# Patient Record
Sex: Female | Born: 1935 | Marital: Married | Smoking: Never smoker
Health system: Southern US, Community
[De-identification: ages and names within clinical notes are randomized; demographics above are authoritative.]

## PROBLEM LIST (undated history)

## (undated) DIAGNOSIS — I639 Cerebral infarction, unspecified: Secondary | ICD-10-CM

## (undated) HISTORY — DX: Cerebral infarction, unspecified: I63.9

---

## 2014-02-26 ENCOUNTER — Emergency Department: Payer: Self-pay | Admitting: Emergency Medicine

## 2018-09-22 ENCOUNTER — Emergency Department: Payer: Medicaid Other

## 2018-09-22 ENCOUNTER — Emergency Department
Admission: EM | Admit: 2018-09-22 | Discharge: 2018-09-22 | Disposition: A | Discharge status: Home / Self Care | Payer: Medicaid Other | Attending: Emergency Medicine | Admitting: Emergency Medicine

## 2018-09-22 ENCOUNTER — Other Ambulatory Visit: Payer: Self-pay

## 2018-09-22 ENCOUNTER — Encounter: Payer: Self-pay | Admitting: Emergency Medicine

## 2018-09-22 DIAGNOSIS — M25512 Pain in left shoulder: Secondary | ICD-10-CM | POA: Insufficient documentation

## 2018-09-22 LAB — BASIC METABOLIC PANEL
Anion gap: 8 (ref 5–15)
BUN: 18 mg/dL (ref 8–23)
CALCIUM: 9.5 mg/dL (ref 8.9–10.3)
CHLORIDE: 104 mmol/L (ref 98–111)
CO2: 27 mmol/L (ref 22–32)
CREATININE: 0.88 mg/dL (ref 0.44–1.00)
GFR calc non Af Amer: 60 mL/min — ABNORMAL LOW (ref >60)
Glucose, Bld: 93 mg/dL (ref 70–99)
Potassium: 4.3 mmol/L (ref 3.5–5.1)
Sodium: 139 mmol/L (ref 135–145)

## 2018-09-22 LAB — FIBRIN DERIVATIVES D-DIMER (ARMC ONLY): FIBRIN DERIVATIVES D-DIMER (ARMC): 469.45 ng{FEU}/mL (ref 0.00–499.00)

## 2018-09-22 LAB — CBC
HEMATOCRIT: 36 % (ref 35.0–47.0)
HEMOGLOBIN: 11.8 g/dL — AB (ref 12.0–16.0)
MCH: 28.7 pg (ref 26.0–34.0)
MCHC: 32.7 g/dL (ref 32.0–36.0)
MCV: 87.7 fL (ref 80.0–100.0)
Platelets: 258 10*3/uL (ref 150–440)
RBC: 4.11 MIL/uL (ref 3.80–5.20)
RDW: 13.5 % (ref 11.5–14.5)
WBC: 4.7 10*3/uL (ref 3.6–11.0)

## 2018-09-22 LAB — TROPONIN I: Troponin I: <0.03 ng/mL (ref <0.03)

## 2018-09-22 MED ORDER — ACETAMINOPHEN 500 MG PO TABS
1000.0000 mg | ORAL_TABLET | Freq: Once | ORAL | Status: AC
  Administered 2018-09-22: 1000 mg via ORAL
  Filled 2018-09-22: qty 2

## 2018-09-22 MED ORDER — OXYCODONE HCL 5 MG PO TABS: 2.5000 mg | ORAL_TABLET | Freq: Four times a day (QID) | ORAL | 0 refills | Status: DC | PRN

## 2018-09-22 NOTE — ED Notes (Signed)
RN in room to administer ordered medication. Pt is currently off the floor in radiology.

## 2018-09-22 NOTE — Discharge Instructions (Addendum)
Pain control: Take tylenol 1000mg every 8 hours. Take 2.5mg of oxycodone every 6 hours for breakthrough pain. If you need the oxycodone make sure to take one senokot as well to prevent constipation. ° °Do not drink alcohol, drive or participate in any other potentially dangerous activities while taking this medication as it may make you sleepy. Do not take this medication with any other sedating medications, either prescription or over-the-counter. ° °

## 2018-09-22 NOTE — ED Triage Notes (Signed)
L shoulder pain x 1 week, usually has shoulder pain but worse. Hurts with movement. Denies fall or injury.

## 2018-09-22 NOTE — ED Provider Notes (Signed)
[facility] Emergency Department Provider Note  ____________________________________________  Time seen: Approximately 9:14 AM  I have reviewed the triage vital signs and the nursing notes.   HISTORY  Chief Complaint Shoulder Pain  Level 5 caveat:  Portions of the history and physical were unable to be obtained due to language barrier (granddaughter used as interpreter, no interpreter for patient's language available)   HPI Allison Ballard is a 84 y.o. female no significant past medical history who presents for evaluation of left shoulder pain.  Patient reports that she has had pain for 15 years in the shoulder that she developed while lifting a heavy box.  She reports that the pain is mild and usually bearable however over the last week the pain has been severe.  She has no pain at rest but has severe pain with movement of her shoulder and has had decreased range of motion due to the pain.  No fever or chills, no chest pain or shortness of breath.  The pain seems to radiate to the left scapula region.  No recent trauma.  Patient reports that she woke up with this pain a week ago.  She has tried ibuprofen at home which helps.  Patient reports no medical problems however has not seen a doctor in several years.  No history of smoking.  No personal family history of blood clots.  PMH Chronic left shoulder pain  Allergies Patient has no known allergies.  FH No heart disease, PE/DVT  Social History Smoking: never Alcohol: no Drugs: no   Review of Systems  Constitutional: Negative for fever. Eyes: Negative for visual changes. ENT: Negative for sore throat. Neck: No neck pain  Cardiovascular: Negative for chest pain. Respiratory: Negative for shortness of breath. Gastrointestinal: Negative for abdominal pain, vomiting or diarrhea. Genitourinary: Negative for dysuria. Musculoskeletal: Negative for back pain. + L shoulder pain Skin: Negative for  rash. Neurological: Negative for headaches, weakness or numbness. Psych: No SI or HI  ____________________________________________   PHYSICAL EXAM:  VITAL SIGNS: ED Triage Vitals  Enc Vitals Group     BP 09/22/18 0825 (!) 148/76     Pulse Rate 09/22/18 0825 76     Resp 09/22/18 0825 18     Temp 09/22/18 0825 98.1 F (36.7 C)     Temp Source 09/22/18 0825 Oral     SpO2 09/22/18 0825 99 %     Weight 09/22/18 0826 130 lb (59 kg)     Height 09/22/18 0826 5' (1.524 m)     Head Circumference --      Peak Flow --      Pain Score 09/22/18 0825 9     Pain Loc --      Pain Edu? --      Excl. in GC? --     Constitutional: Alert and oriented. Well appearing and in no apparent distress. HEENT:      Head: Normocephalic and atraumatic.         Eyes: Conjunctivae are normal. Sclera is non-icteric.       Mouth/Throat: Mucous membranes are moist.       Neck: Supple with no signs of meningismus. Cardiovascular: Regular rate and rhythm. No murmurs, gallops, or rubs. 2+ symmetrical distal pulses are present in all extremities. No JVD. Respiratory: Normal respiratory effort. Lungs are clear to auscultation bilaterally. No wheezes, crackles, or rhonchi.  Gastrointestinal: Soft, non tender, and non distended with positive bowel sounds. No rebound or guarding. Musculoskeletal: Patient has tenderness  to palpation over the L scapula and posterior shoulder region, no swelling or erythema, ROM is slightly limited especially with abduction above 90 degrees. Strong distal pulses. Nontender with normal range of motion in all extremities. No edema, cyanosis, or erythema of extremities. Neurologic: Normal speech and language. Face is symmetric. Moving all extremities. No gross focal neurologic deficits are appreciated. Skin: Skin is warm, dry and intact. No rash noted. Psychiatric: Mood and affect are normal. Speech and behavior are normal.  ____________________________________________   LABS (all labs  ordered are listed, but only abnormal results are displayed)  Labs Reviewed  CBC - Abnormal; Notable for the following components:      Result Value   Hemoglobin 11.8 (*)    All other components within normal limits  BASIC METABOLIC PANEL - Abnormal; Notable for the following components:   GFR calc non Af Amer 60 (*)    All other components within normal limits  TROPONIN I  FIBRIN DERIVATIVES D-DIMER ([facility] ONLY)   ____________________________________________  EKG  ED ECG REPORT I, Nita Sickle, the attending physician, personally viewed and interpreted this ECG.  Normal sinus rhythm, rate of 70, normal intervals, normal axis, no ST elevations or depressions.  Unchanged from prior from 2015. ____________________________________________  RADIOLOGY  I have personally reviewed the images performed during this visit and I agree with the Radiologist's read.   Interpretation by Radiologist:  Dg Chest 2 View  Result Date: 09/22/2018 CLINICAL DATA:  Left shoulder pain. EXAM: CHEST - 2 VIEW COMPARISON:  None. FINDINGS: Normal sized heart. Tortuous aorta. Large right diaphragmatic eventration and smaller left diaphragmatic eventration. Small amount of linear atelectasis or scarring at both lung bases. Mild to moderate thoracic scoliosis. No acute bony abnormality seen. IMPRESSION: Small amount of linear atelectasis or scarring at both lung bases. Otherwise, no acute abnormality. Electronically Signed   By: Beckie Salts M.D.   On: 09/22/2018 09:52   Dg Shoulder Left  Result Date: 09/22/2018 CLINICAL DATA:  Left shoulder pain.  No known injury. EXAM: LEFT SHOULDER - 2+ VIEW COMPARISON:  None. FINDINGS: Degenerative changes in the left shoulder with joint space narrowing. No acute bony abnormality. Specifically, no fracture, subluxation, or dislocation. : Mild degenerative changes.  No acute bony abnormality. Electronically Signed   By: Charlett Nose M.D.   On: 09/22/2018 09:12     ____________________________________________   PROCEDURES  Procedure(s) performed: None Procedures Critical Care performed:  None ____________________________________________   INITIAL IMPRESSION / ASSESSMENT AND PLAN / ED COURSE  84 y.o. female no significant past medical history who presents for evaluation of left shoulder pain.  Pain seems to be musculoskeletal in nature with history of chronic pain now exacerbated over the last week with limited mostly preserved range of motion and tenderness over the posterior aspect of the shoulder.  X-ray showing mild degenerative changes with no dislocation or fracture.  Due to language barrier and patient being a poor historian EKG and basic labs including d-dimer have been done especially since XR of the shoulder is otherwise fairly normal. Ddx rotator cuff injury vs arthritis vs PE vs rib fx. Will give tylenol for pain.    _________________________ 11:07 AM on 09/22/2018 -----------------------------------------  Labs and CXR with no acute findings. Pain is improved with tylenol.  At this time I am concerned for rotator cuff pathology.  Recommend follow-up with [facility].  Discussed pain control with 1000 mg of Tylenol 3 times daily and 2.5 mg of oxycodone every 6 hours as needed  as needed.  Recommended taking Senokot with oxycodone to avoid constipation.  Discussed standard return precautions with patient's granddaughter.   As part of my medical decision making, I reviewed the following data within the electronic MEDICAL RECORD NUMBER History obtained from family, Nursing notes reviewed and incorporated, Labs reviewed , EKG interpreted , Old EKG reviewed, Old chart reviewed, Radiograph reviewed , Notes from prior ED visits and [place] Controlled Substance Database    Pertinent labs & imaging results that were available during my care of the patient were reviewed by me and considered in my medical decision making (see chart for  details).    ____________________________________________   FINAL CLINICAL IMPRESSION(S) / ED DIAGNOSES  Final diagnoses:  Left shoulder pain, unspecified chronicity      NEW MEDICATIONS STARTED DURING THIS VISIT:  ED Discharge Orders         Ordered    oxyCODONE (ROXICODONE) 5 MG immediate release tablet  Every 6 hours PRN     09/22/18 1101           Note:  This document was prepared using Dragon voice recognition software and may include unintentional dictation errors.    Nita Sickle, MD 09/22/18 [phone]

## 2018-09-22 NOTE — ED Notes (Signed)
Offer for video interpreter was declined.

## 2018-09-22 NOTE — ED Notes (Signed)
Patient transported to X-ray 

## 2019-07-29 ENCOUNTER — Ambulatory Visit: Payer: Self-pay | Admitting: Adult Health

## 2019-07-31 ENCOUNTER — Ambulatory Visit
Admission: RE | Admit: 2019-07-31 | Discharge: 2019-07-31 | Disposition: A | Discharge status: Home / Self Care | Payer: Medicaid Other | Source: Ambulatory Visit | Attending: Adult Health | Admitting: Adult Health

## 2019-07-31 ENCOUNTER — Ambulatory Visit
Admission: RE | Admit: 2019-07-31 | Discharge: 2019-07-31 | Disposition: A | Discharge status: Home / Self Care | Payer: Medicaid Other | Attending: Adult Health | Admitting: Adult Health

## 2019-07-31 ENCOUNTER — Encounter: Payer: Self-pay | Admitting: Adult Health

## 2019-07-31 ENCOUNTER — Ambulatory Visit: Payer: Medicaid Other | Admitting: Adult Health

## 2019-07-31 ENCOUNTER — Other Ambulatory Visit: Payer: Self-pay

## 2019-07-31 VITALS — BP 174/87 | HR 70 | Resp 16 | Ht 62.0 in | Wt 131.2 lb

## 2019-07-31 DIAGNOSIS — H538 Other visual disturbances: Secondary | ICD-10-CM

## 2019-07-31 DIAGNOSIS — G8929 Other chronic pain: Secondary | ICD-10-CM

## 2019-07-31 DIAGNOSIS — M25562 Pain in left knee: Secondary | ICD-10-CM | POA: Diagnosis present

## 2019-07-31 DIAGNOSIS — I10 Essential (primary) hypertension: Secondary | ICD-10-CM | POA: Diagnosis not present

## 2019-07-31 DIAGNOSIS — H6123 Impacted cerumen, bilateral: Secondary | ICD-10-CM

## 2019-07-31 MED ORDER — DEBROX 6.5 % OT SOLN: 5.0000 [drp] | Freq: Two times a day (BID) | OTIC | 0 refills | Status: DC

## 2019-07-31 NOTE — Progress Notes (Signed)
[facility] [address] [place], [place] [address]  Internal MEDICINE  Office Visit Note  Patient Name: Allison Ballard  [id number]  [id number]  Date of Service: 07/31/2019   Complaints/HPI Pt is here for establishment of PCP. Chief Complaint  Patient presents with  . Establish Care  . Gastroesophageal Reflux    states that she is on meds for this from [place]  . Hypertension    states that she is on meds for this from [place]  . Knee Pain    left knee pain  . Blurred Vision    eyes get watery and when she read she feels as if there is stuff in her eyes   HPI Pt is here to establish care.  She is currently here from [place] for the year.  She is currently treated for htn on medication from [place].  She is unsure what the name of it is.  She was also treated for GERD, but is now using OTC medication.  She is complaining of left knee pain.  She has this often, and she takes tylenol with good relief.  She also complains of blurred vision for the past week.  She states that when she reads, her eyes become watery and blurry.    Current Medication: Outpatient Encounter Medications as of 07/31/2019  Medication Sig  . oxyCODONE (ROXICODONE) 5 MG immediate release tablet Take 0.5 tablets (2.5 mg total) by mouth every 6 (six) hours as needed for severe pain.   No facility-administered encounter medications on file as of 07/31/2019.     Surgical History: History reviewed. No pertinent surgical history.  Medical History: History reviewed. No pertinent past medical history.  Family History: History reviewed. No pertinent family history.  Social History   Socioeconomic History  . Marital status: Married    Spouse name: Not on file  . Number of children: Not on file  . Years of education: Not on file  . Highest education level: Not on file  Occupational History  . Not on file  Social Needs  . Financial resource strain: Not on file  . Food insecurity    Worry: Not on  file    Inability: Not on file  . Transportation needs    Medical: Not on file    Non-medical: Not on file  Tobacco Use  . Smoking status: Never Smoker  . Smokeless tobacco: Never Used  Substance and Sexual Activity  . Alcohol use: Never    Frequency: Never  . Drug use: Never  . Sexual activity: Not on file  Lifestyle  . Physical activity    Days per week: Not on file    Minutes per session: Not on file  . Stress: Not on file  Relationships  . Social Musicianconnections    Talks on phone: Not on file    Gets together: Not on file    Attends religious service: Not on file    Active member of club or organization: Not on file    Attends meetings of clubs or organizations: Not on file    Relationship status: Not on file  . Intimate partner violence    Fear of current or ex partner: Not on file    Emotionally abused: Not on file    Physically abused: Not on file    Forced sexual activity: Not on file  Other Topics Concern  . Not on file  Social History Narrative  . Not on file     Review of Systems  Constitutional: Negative for chills, fatigue and unexpected weight change.  HENT: Negative for congestion, rhinorrhea, sneezing and sore throat.   Eyes: Negative for photophobia, pain and redness.  Respiratory: Negative for cough, chest tightness and shortness of breath.   Cardiovascular: Negative for chest pain and palpitations.  Gastrointestinal: Negative for abdominal pain, constipation, diarrhea, nausea and vomiting.  Endocrine: Negative.   Genitourinary: Negative for dysuria and frequency.  Musculoskeletal: Negative for arthralgias, back pain, joint swelling and neck pain.  Skin: Negative for rash.  Allergic/Immunologic: Negative.   Neurological: Negative for tremors and numbness.  Hematological: Negative for adenopathy. Does not bruise/bleed easily.  Psychiatric/Behavioral: Negative for behavioral problems and sleep disturbance. The patient is not nervous/anxious.      Vital Signs: BP (!) 174/87   Pulse 70   Resp 16   Ht 5\' 2"  (1.575 m)   Wt 131 lb 3.2 oz (59.5 kg)   SpO2 97%   BMI 24.00 kg/m    Physical Exam Vitals signs and nursing note reviewed.  Constitutional:      General: She is not in acute distress.    Appearance: She is well-developed. She is not diaphoretic.  HENT:     Head: Normocephalic and atraumatic.     Mouth/Throat:     Pharynx: No oropharyngeal exudate.  Eyes:     Pupils: Pupils are equal, round, and reactive to light.  Neck:     Musculoskeletal: Normal range of motion and neck supple.     Thyroid: No thyromegaly.     Vascular: No JVD.     Trachea: No tracheal deviation.  Cardiovascular:     Rate and Rhythm: Normal rate and regular rhythm.     Heart sounds: Normal heart sounds. No murmur. No friction rub. No gallop.   Pulmonary:     Effort: Pulmonary effort is normal. No respiratory distress.     Breath sounds: Normal breath sounds. No wheezing or rales.  Chest:     Chest wall: No tenderness.  Abdominal:     Palpations: Abdomen is soft.     Tenderness: There is no abdominal tenderness. There is no guarding.  Musculoskeletal: Normal range of motion.  Lymphadenopathy:     Cervical: No cervical adenopathy.  Skin:    General: Skin is warm and dry.  Neurological:     Mental Status: She is alert and oriented to person, place, and time.     Cranial Nerves: No cranial nerve deficit.  Psychiatric:        Behavior: Behavior normal.        Thought Content: Thought content normal.        Judgment: Judgment normal.    Assessment/Plan: 1. Essential hypertension Elevated today, she declined to be placed on bp meds. Will continue to monitor. She does take a medication from [place].   2. Blurred vision - Ambulatory referral to Optometry  3. Chronic pain of left knee  Films ordered.  - DG Knee Complete 4 Views Left; Future  4. Bilateral impacted cerumen Use debrox as directed.  - carbamide peroxide (DEBROX) 6.5 %  OTIC solution; Place 5 drops into both ears 2 (two) times daily.  Dispense: 15 mL; Refill: 0  General Counseling: [name] verbalizes understanding of the findings of todays visit and agrees with plan of treatment. I have discussed any further diagnostic evaluation that may be needed or ordered today. We also reviewed her medications today. she has been encouraged to call the office with any questions or concerns that should  arise related to todays visit.  No orders of the defined types were placed in this encounter.   No orders of the defined types were placed in this encounter.   Time spent: 30 Minutes   This patient was seen by Orson Gear AGNP-C in Collaboration with Dr Lavera Guise as a part of collaborative care agreement  Kendell Bane AGNP-C Internal Medicine

## 2019-08-07 ENCOUNTER — Encounter: Payer: Self-pay | Admitting: Adult Health

## 2019-08-21 ENCOUNTER — Ambulatory Visit: Payer: Medicaid Other | Admitting: Adult Health

## 2020-02-13 ENCOUNTER — Other Ambulatory Visit: Payer: Self-pay

## 2020-02-13 ENCOUNTER — Encounter: Payer: Self-pay | Admitting: Adult Health

## 2020-02-13 ENCOUNTER — Ambulatory Visit: Payer: Medicaid Other | Admitting: Adult Health

## 2020-02-13 VITALS — BP 154/63 | HR 65 | Temp 97.1°F | Resp 16 | Ht 63.0 in | Wt 133.2 lb

## 2020-02-13 DIAGNOSIS — H6121 Impacted cerumen, right ear: Secondary | ICD-10-CM | POA: Diagnosis not present

## 2020-02-13 DIAGNOSIS — H6123 Impacted cerumen, bilateral: Secondary | ICD-10-CM

## 2020-02-13 DIAGNOSIS — I10 Essential (primary) hypertension: Secondary | ICD-10-CM

## 2020-02-13 MED ORDER — DEBROX 6.5 % OT SOLN: 5.0000 [drp] | Freq: Two times a day (BID) | OTIC | 0 refills | Status: DC

## 2020-02-13 NOTE — Progress Notes (Signed)
[facility] [place], [place] [address]  Internal MEDICINE  Office Visit Note  Patient Name: Allison Ballard  [id number]  [id number]  Date of Service: 02/13/2020  Chief Complaint  Patient presents with  . Ear Pain    ear pain in right ear trouble hearing     HPI Pt is here for a sick visit.  Patient is here with granddaughter who reports patient has been complaining of right ear pain for a few weeks.  She also can not hear out of it.  She is also complaining of left ear heaviness also. She denies ever having this issue in the past.      Current Medication:  Outpatient Encounter Medications as of 02/13/2020  Medication Sig  . amLODipine (NORVASC) 5 MG tablet Take 5 mg by mouth daily.  Marland Kitchen atenolol (TENORMIN) 50 MG tablet Take 50 mg by mouth daily. This is a comb medication with Amlodipine, purchased in [place]. She takes this every other day.  . carbamide peroxide (DEBROX) 6.5 % OTIC solution Place 5 drops into both ears 2 (two) times daily.  Marland Kitchen oxyCODONE (ROXICODONE) 5 MG immediate release tablet Take 0.5 tablets (2.5 mg total) by mouth every 6 (six) hours as needed for severe pain.  . [DISCONTINUED] carbamide peroxide (DEBROX) 6.5 % OTIC solution Place 5 drops into both ears 2 (two) times daily.   No facility-administered encounter medications on file as of 02/13/2020.      Medical History: History reviewed. No pertinent past medical history.   Vital Signs: BP (!) 154/63   Pulse 65   Temp (!) 97.1 F (36.2 C)   Resp 16   Ht 5\' 3"  (1.6 m)   Wt 133 lb 3.2 oz (60.4 kg)   SpO2 98%   BMI 23.60 kg/m    Review of Systems  Constitutional: Negative for chills, fatigue and unexpected weight change.  HENT: Positive for ear pain. Negative for congestion, rhinorrhea, sneezing and sore throat.   Eyes: Negative for photophobia, pain and redness.  Respiratory: Negative for cough, chest tightness and shortness of breath.   Cardiovascular: Negative for  chest pain and palpitations.  Gastrointestinal: Negative for abdominal pain, constipation, diarrhea, nausea and vomiting.  Endocrine: Negative.   Genitourinary: Negative for dysuria and frequency.  Musculoskeletal: Negative for arthralgias, back pain, joint swelling and neck pain.  Skin: Negative for rash.  Allergic/Immunologic: Negative.   Neurological: Negative for tremors and numbness.  Hematological: Negative for adenopathy. Does not bruise/bleed easily.  Psychiatric/Behavioral: Negative for behavioral problems and sleep disturbance. The patient is not nervous/anxious.     Physical Exam Vitals and nursing note reviewed.  Constitutional:      General: She is not in acute distress.    Appearance: She is well-developed. She is not diaphoretic.  HENT:     Head: Normocephalic and atraumatic.     Right Ear: There is impacted cerumen.     Left Ear: Tympanic membrane normal.     Mouth/Throat:     Pharynx: No oropharyngeal exudate.  Eyes:     Pupils: Pupils are equal, round, and reactive to light.  Neck:     Thyroid: No thyromegaly.     Vascular: No JVD.     Trachea: No tracheal deviation.  Cardiovascular:     Rate and Rhythm: Normal rate and regular rhythm.     Heart sounds: Normal heart sounds. No murmur. No friction rub. No gallop.   Pulmonary:     Effort: Pulmonary effort is  normal. No respiratory distress.     Breath sounds: Normal breath sounds. No wheezing or rales.  Chest:     Chest wall: No tenderness.  Abdominal:     Palpations: Abdomen is soft.     Tenderness: There is no abdominal tenderness. There is no guarding.  Musculoskeletal:        General: Normal range of motion.     Cervical back: Normal range of motion and neck supple.  Lymphadenopathy:     Cervical: No cervical adenopathy.  Skin:    General: Skin is warm and dry.  Neurological:     Mental Status: She is alert and oriented to person, place, and time.     Cranial Nerves: No cranial nerve deficit.   Psychiatric:        Behavior: Behavior normal.        Thought Content: Thought content normal.        Judgment: Judgment normal.    Assessment/Plan: 1. Bilateral impacted cerumen May use debrox gtts as discussed.  Good results with ear lavage.  - carbamide peroxide (DEBROX) 6.5 % OTIC solution; Place 5 drops into both ears 2 (two) times daily.  Dispense: 15 mL; Refill: 0  2. Essential hypertension Slightly elevated, continue to monitor.  General Counseling: Shala verbalizes understanding of the findings of todays visit and agrees with plan of treatment. I have discussed any further diagnostic evaluation that may be needed or ordered today. We also reviewed her medications today. she has been encouraged to call the office with any questions or concerns that should arise related to todays visit.   Orders Placed This Encounter  Procedures  . Ear Lavage    Meds ordered this encounter  Medications  . carbamide peroxide (DEBROX) 6.5 % OTIC solution    Sig: Place 5 drops into both ears 2 (two) times daily.    Dispense:  15 mL    Refill:  0    Time spent: 25 Minutes  This patient was seen by Blima Ledger AGNP-C in Collaboration with Dr [name] as a part of collaborative care agreement.  Johnna Acosta AGNP-C Internal Medicine

## 2021-01-28 ENCOUNTER — Encounter: Payer: Self-pay | Admitting: Hospice and Palliative Medicine

## 2021-01-28 ENCOUNTER — Other Ambulatory Visit: Payer: Self-pay

## 2021-01-28 ENCOUNTER — Ambulatory Visit (INDEPENDENT_AMBULATORY_CARE_PROVIDER_SITE_OTHER): Payer: Medicaid Other | Admitting: Hospice and Palliative Medicine

## 2021-01-28 VITALS — BP 190/80 | HR 70 | Temp 97.3°F | Resp 16 | Ht 63.0 in | Wt 136.8 lb

## 2021-01-28 DIAGNOSIS — R1310 Dysphagia, unspecified: Secondary | ICD-10-CM

## 2021-01-28 DIAGNOSIS — I10 Essential (primary) hypertension: Secondary | ICD-10-CM | POA: Diagnosis not present

## 2021-01-28 DIAGNOSIS — R0600 Dyspnea, unspecified: Secondary | ICD-10-CM | POA: Diagnosis not present

## 2021-01-28 DIAGNOSIS — R5383 Other fatigue: Secondary | ICD-10-CM | POA: Diagnosis not present

## 2021-01-28 DIAGNOSIS — R0609 Other forms of dyspnea: Secondary | ICD-10-CM

## 2021-01-28 MED ORDER — ATENOLOL 50 MG PO TABS: 50.0000 mg | ORAL_TABLET | Freq: Every day | ORAL | 0 refills | Status: DC

## 2021-01-28 MED ORDER — AMLODIPINE BESYLATE 5 MG PO TABS: 5.0000 mg | ORAL_TABLET | Freq: Every day | ORAL | 0 refills | Status: DC

## 2021-01-28 NOTE — Progress Notes (Signed)
[facility] [address] [place], [place] [address]  Internal MEDICINE  Office Visit Note  Patient Name: Allison Ballard  [id number]  [id number]  Date of Service: 02/05/2021  Chief Complaint  Patient presents with  . Acute Visit    SOB, heavy breathing from walking short distances, feels like something stuck in throat, started about 3 weeks ago, has gotten worse     HPI Pt is here for a sick visit. Accompanied by her granddaughter whom assists with translation Few concerns today to discuss: -Over last few weeks she has been c/o increased shortness of breath with minimal exertion, becomes SHOB with a few steps when walking and must stop to rest to recover -BP-has been taking medication from [place], unsure of the name of medication, elevated today--denies headaches or visual disturbances -Has been having trouble with swallowing recently--frequently becomes choked when eating, has occurred with solid and liquid foods Denies symptoms of reflux History of CVA--unsure of date, care received while in [place]   Current Medication:  Outpatient Encounter Medications as of 01/28/2021  Medication Sig  . amLODipine (NORVASC) 5 MG tablet Take 1 tablet (5 mg total) by mouth daily.  Marland Kitchen atenolol (TENORMIN) 50 MG tablet Take 1 tablet (50 mg total) by mouth daily.  . carbamide peroxide (DEBROX) 6.5 % OTIC solution Place 5 drops into both ears 2 (two) times daily. (Patient not taking: Reported on 01/28/2021)  . oxyCODONE (ROXICODONE) 5 MG immediate release tablet Take 0.5 tablets (2.5 mg total) by mouth every 6 (six) hours as needed for severe pain. (Patient not taking: Reported on 01/28/2021)  . [DISCONTINUED] amLODipine (NORVASC) 5 MG tablet Take 5 mg by mouth daily. (Patient not taking: Reported on 01/28/2021)  . [DISCONTINUED] atenolol (TENORMIN) 50 MG tablet Take 50 mg by mouth daily. This is a comb medication with Amlodipine, purchased in [place]. She takes this every other day. (Patient  not taking: Reported on 01/28/2021)   No facility-administered encounter medications on file as of 01/28/2021.      Medical History: History reviewed. No pertinent past medical history.   Vital Signs: BP (!) 190/80   Pulse 70   Temp (!) 97.3 F (36.3 C)   Resp 16   Ht 5\' 3"  (1.6 m)   Wt 136 lb 12.8 oz (62.1 kg)   SpO2 98%   BMI 24.23 kg/m    Review of Systems  Constitutional: Negative for chills, diaphoresis and fatigue.  HENT: Negative for ear pain, postnasal drip and sinus pressure.   Eyes: Negative for photophobia, discharge, redness, itching and visual disturbance.  Respiratory: Positive for shortness of breath. Negative for cough and wheezing.   Cardiovascular: Negative for chest pain, palpitations and leg swelling.  Gastrointestinal: Negative for abdominal pain, constipation, diarrhea, nausea and vomiting.       Difficulty swallowing, episodes of choking  Genitourinary: Negative for dysuria and flank pain.  Musculoskeletal: Negative for arthralgias, back pain, gait problem and neck pain.  Skin: Negative for color change.  Allergic/Immunologic: Negative for environmental allergies and food allergies.  Neurological: Negative for dizziness and headaches.  Hematological: Does not bruise/bleed easily.  Psychiatric/Behavioral: Negative for agitation, behavioral problems (depression) and hallucinations.    Physical Exam Vitals reviewed.  Constitutional:      Appearance: Normal appearance. She is normal weight.  Cardiovascular:     Rate and Rhythm: Normal rate and regular rhythm.     Pulses: Normal pulses.     Heart sounds: Normal heart sounds.  Pulmonary:  Effort: Pulmonary effort is normal.     Breath sounds: Normal breath sounds.  Abdominal:     General: Abdomen is flat.     Palpations: Abdomen is soft.  Musculoskeletal:        General: Normal range of motion.     Cervical back: Normal range of motion.  Skin:    General: Skin is warm.  Neurological:      General: No focal deficit present.     Mental Status: She is alert and oriented to person, place, and time. Mental status is at baseline.  Psychiatric:        Mood and Affect: Mood normal.        Behavior: Behavior normal.        Thought Content: Thought content normal.        Judgment: Judgment normal.    Assessment/Plan: 1. Essential hypertension BP significantly elevated today--restart amlodipine as well as atenolol as this has been previously prescribed Review labs and adjust therapy as indicated - CBC w/Diff/Platelet - Comprehensive Metabolic Panel (CMET) - Lipid Panel With LDL/HDL Ratio - TSH + free T4 - amLODipine (NORVASC) 5 MG tablet; Take 1 tablet (5 mg total) by mouth daily.  Dispense: 90 tablet; Refill: 0 - atenolol (TENORMIN) 50 MG tablet; Take 1 tablet (50 mg total) by mouth daily.  Dispense: 90 tablet; Refill: 0  2. Dyspnea on exertion Will review echo for underlying cardiac etiology of DOE - ECHOCARDIOGRAM COMPLETE; Future  3. Dysphagia, unspecified type Upper GI study for further evaluation and treat accordingly  4. Other fatigue - CBC w/Diff/Platelet - Comprehensive Metabolic Panel (CMET) - Lipid Panel With LDL/HDL Ratio - TSH + free T4  General Counseling: Allison Ballard verbalizes understanding of the findings of todays visit and agrees with plan of treatment. I have discussed any further diagnostic evaluation that may be needed or ordered today. We also reviewed her medications today. she has been encouraged to call the office with any questions or concerns that should arise related to todays visit.   Orders Placed This Encounter  Procedures  . CBC w/Diff/Platelet  . Comprehensive Metabolic Panel (CMET)  . Lipid Panel With LDL/HDL Ratio  . TSH + free T4  . ECHOCARDIOGRAM COMPLETE    Meds ordered this encounter  Medications  . amLODipine (NORVASC) 5 MG tablet    Sig: Take 1 tablet (5 mg total) by mouth daily.    Dispense:  90 tablet    Refill:  0  .  atenolol (TENORMIN) 50 MG tablet    Sig: Take 1 tablet (50 mg total) by mouth daily.    Dispense:  90 tablet    Refill:  0    Time spent: 30 Minutes  This patient was seen by Leeanne Deed AGNP-C in Collaboration with Dr [name] as a part of collaborative care agreement.  Lubertha Basque [facility] Internal Medicine

## 2021-02-03 ENCOUNTER — Ambulatory Visit: Payer: Medicaid Other

## 2021-02-04 ENCOUNTER — Other Ambulatory Visit: Payer: Self-pay | Admitting: Hospice and Palliative Medicine

## 2021-02-04 ENCOUNTER — Other Ambulatory Visit: Payer: Self-pay

## 2021-02-04 ENCOUNTER — Ambulatory Visit
Admission: RE | Admit: 2021-02-04 | Discharge: 2021-02-04 | Disposition: A | Discharge status: Home / Self Care | Payer: Medicaid Other | Source: Ambulatory Visit | Attending: Hospice and Palliative Medicine | Admitting: Hospice and Palliative Medicine

## 2021-02-04 DIAGNOSIS — R1310 Dysphagia, unspecified: Secondary | ICD-10-CM

## 2021-02-05 ENCOUNTER — Other Ambulatory Visit: Payer: Self-pay | Admitting: Hospice and Palliative Medicine

## 2021-02-05 ENCOUNTER — Encounter: Payer: Self-pay | Admitting: Hospice and Palliative Medicine

## 2021-02-05 DIAGNOSIS — K209 Esophagitis, unspecified without bleeding: Secondary | ICD-10-CM

## 2021-02-05 MED ORDER — OMEPRAZOLE 40 MG PO CPDR: 40.0000 mg | DELAYED_RELEASE_CAPSULE | Freq: Every day | ORAL | 0 refills | Status: DC

## 2021-02-05 NOTE — Progress Notes (Signed)
Called and spoke with granddaughter about results from upper GI. Will restart PPI and referral to GI for possible endoscopy.

## 2021-02-10 ENCOUNTER — Other Ambulatory Visit: Payer: Self-pay

## 2021-02-10 ENCOUNTER — Ambulatory Visit (INDEPENDENT_AMBULATORY_CARE_PROVIDER_SITE_OTHER): Payer: Medicaid Other | Admitting: Gastroenterology

## 2021-02-10 VITALS — BP 167/72 | HR 75 | Temp 97.5°F | Ht 63.0 in | Wt 137.0 lb

## 2021-02-10 DIAGNOSIS — R1310 Dysphagia, unspecified: Secondary | ICD-10-CM

## 2021-02-10 DIAGNOSIS — R0609 Other forms of dyspnea: Secondary | ICD-10-CM

## 2021-02-10 DIAGNOSIS — R0600 Dyspnea, unspecified: Secondary | ICD-10-CM

## 2021-02-10 LAB — COMPREHENSIVE METABOLIC PANEL
ALT: 19 IU/L (ref 0–32)
AST: 25 IU/L (ref 0–40)
Albumin/Globulin Ratio: 1.4 (ref 1.2–2.2)
Albumin: 4.1 g/dL (ref 3.6–4.6)
Alkaline Phosphatase: 87 IU/L (ref 44–121)
BUN/Creatinine Ratio: 16 (ref 12–28)
BUN: 14 mg/dL (ref 8–27)
Bilirubin Total: 0.4 mg/dL (ref 0.0–1.2)
CO2: 24 mmol/L (ref 20–29)
Calcium: 9.3 mg/dL (ref 8.7–10.3)
Chloride: 95 mmol/L — ABNORMAL LOW (ref 96–106)
Creatinine, Ser: 0.88 mg/dL (ref 0.57–1.00)
GFR calc Af Amer: 70 mL/min/{1.73_m2} (ref >59)
GFR calc non Af Amer: 61 mL/min/{1.73_m2} (ref >59)
Globulin, Total: 2.9 g/dL (ref 1.5–4.5)
Glucose: 94 mg/dL (ref 65–99)
Potassium: 5.1 mmol/L (ref 3.5–5.2)
Sodium: 132 mmol/L — ABNORMAL LOW (ref 134–144)
Total Protein: 7 g/dL (ref 6.0–8.5)

## 2021-02-10 LAB — CBC WITH DIFFERENTIAL/PLATELET
Basophils Absolute: 0 10*3/uL (ref 0.0–0.2)
Basos: 1 %
EOS (ABSOLUTE): 0.2 10*3/uL (ref 0.0–0.4)
Eos: 3 %
Hematocrit: 32 % — ABNORMAL LOW (ref 34.0–46.6)
Hemoglobin: 11 g/dL — ABNORMAL LOW (ref 11.1–15.9)
Immature Grans (Abs): 0 10*3/uL (ref 0.0–0.1)
Immature Granulocytes: 0 %
Lymphocytes Absolute: 1.2 10*3/uL (ref 0.7–3.1)
Lymphs: 23 %
MCH: 28.5 pg (ref 26.6–33.0)
MCHC: 34.4 g/dL (ref 31.5–35.7)
MCV: 83 fL (ref 79–97)
Monocytes Absolute: 0.6 10*3/uL (ref 0.1–0.9)
Monocytes: 11 %
Neutrophils Absolute: 3.2 10*3/uL (ref 1.4–7.0)
Neutrophils: 62 %
Platelets: 285 10*3/uL (ref 150–450)
RBC: 3.86 x10E6/uL (ref 3.77–5.28)
RDW: 11.9 % (ref 11.7–15.4)
WBC: 5.1 10*3/uL (ref 3.4–10.8)

## 2021-02-10 LAB — LIPID PANEL WITH LDL/HDL RATIO
Cholesterol, Total: 195 mg/dL (ref 100–199)
HDL: 79 mg/dL (ref >39)
LDL Chol Calc (NIH): 106 mg/dL — ABNORMAL HIGH (ref 0–99)
LDL/HDL Ratio: 1.3 ratio (ref 0.0–3.2)
Triglycerides: 51 mg/dL (ref 0–149)
VLDL Cholesterol Cal: 10 mg/dL (ref 5–40)

## 2021-02-10 LAB — TSH+FREE T4
Free T4: 1.41 ng/dL (ref 0.82–1.77)
TSH: 2.26 u[IU]/mL (ref 0.450–4.500)

## 2021-02-10 NOTE — Progress Notes (Signed)
Will you see if we can add on anemia panel please? Thanks.

## 2021-02-10 NOTE — Progress Notes (Signed)
Allison Mood MD, MRCP(U.K) [address]  [address]  [place], [place] [address]  Main: [phone]  Fax: [phone]   Gastroenterology Consultation  Referring Provider:     Theotis Burrow, NP Primary Care Physician:  Allison Code, MD Primary Gastroenterologist:  Dr. Wyline Ballard  Reason for Consultation:     Esophagitis        HPI:   Allison Ballard is a 87 y.o. y/o female referred for consultation & management  by Dr. Welton Ballard, Allison Harper, MD.    She was recently referred to [place] for dysphagia.  History of CVA while in [place].  She had some dyspnea on exertion.  Barium swallow showed tertiary esophageal contractions consistent with presbyesophagus mild pliable distal esophageal narrowing may be secondary to spasm versus esophagitis, small hiatal hernia, moderate GERD.  Hemoglobin on 02/09/2021 11 g with an MCV of 83 which is very similar to what it was 2 years back..  She is here today with her grandson.  Been in [place] for over 3 years.  She was able to walk from the car downstairs to the office without getting shortness of breath.  She says for the past few months has had some difficulty swallowing.  The food goes down but she feels something sticking in the back of her throat.  Denies any coughing or gagging or choking while eating.  Denies any weight loss.  No history of smoking or alcohol intake.  She has been started on omeprazole 40 mg once a day recently.  No prior endoscopy.  She has prior history of heartburn which she terms as acidity   No past surgical history on file.  Prior to Admission medications   Medication Sig Start Date End Date Taking? Authorizing Provider  amLODipine (NORVASC) 5 MG tablet Take 1 tablet (5 mg total) by mouth daily. 01/28/21   Allison Burrow, NP  atenolol (TENORMIN) 50 MG tablet Take 1 tablet (50 mg total) by mouth daily. 01/28/21   Allison Burrow, NP  carbamide peroxide (DEBROX) 6.5 % OTIC solution Place 5 drops into both ears 2 (two) times  daily. Patient not taking: Reported on 01/28/2021 02/13/20   Allison Acosta, NP  omeprazole (PRILOSEC) 40 MG capsule Take 1 capsule (40 mg total) by mouth daily. 02/05/21   Allison Burrow, NP  oxyCODONE (ROXICODONE) 5 MG immediate release tablet Take 0.5 tablets (2.5 mg total) by mouth every 6 (six) hours as needed for severe pain. Patient not taking: Reported on 01/28/2021 09/22/18   Allison Sickle, MD    No family history on file.   Social History   Tobacco Use  . Smoking status: Never Smoker  . Smokeless tobacco: Never Used  Substance Use Topics  . Alcohol use: Never  . Drug use: Never    Allergies as of 02/10/2021  . (No Known Allergies)    Review of Systems:    All systems reviewed and negative except where noted in HPI.   Physical Exam:  BP (!) 167/72   Pulse 75   Temp (!) 97.5 F (36.4 C)   Ht 5\' 3"  (1.6 m)   Wt 137 lb (62.1 kg)   BMI 24.27 kg/m  No LMP recorded. Patient is postmenopausal. Psych:  Alert and cooperative. Normal Ballard and affect. General:   Alert,  Well-developed, well-nourished, pleasant and cooperative in NAD Head:  Normocephalic and atraumatic. Eyes:  Sclera clear, no icterus.   Conjunctiva pink. Ears:  Normal auditory acuity. Lungs:  Respirations even and unlabored.  Clear throughout to auscultation.   No wheezes, crackles, or rhonchi. No acute distress. Heart:  Regular rate and rhythm; no murmurs, clicks, rubs, or gallops. Abdomen:  Normal bowel sounds.  No bruits.  Soft, non-tender and non-distended without masses, hepatosplenomegaly or hernias noted.  No guarding or rebound tenderness.    Neurologic:  Alert and oriented x3;  grossly normal neurologically. Psych:  Alert and cooperative. Normal Ballard and affect.  Imaging Studies: DG UGI W SINGLE CM (SOL OR THIN BA)  Result Date: 02/04/2021 CLINICAL DATA:  Dysphagia.  History of CVA. EXAM: UPPER GI SERIES WITHOUT KUB TECHNIQUE: Routine upper GI series was performed with thin density barium.  FLUOROSCOPY TIME:  Fluoroscopy Time:  1 minutes 12 seconds Radiation Exposure Index (if provided by the fluoroscopic device): 31.7 mGy COMPARISON:  No prior. FINDINGS: Tertiary esophageal contractions consistent with presbyesophagus. Mild pliable distal esophageal narrowing. This may be secondary to mild spasm/esophagitis. Small hiatal hernia. Moderate reflux. Stomach and duodenum are widely patent. No mass lesion. No evidence of ulceration. Two duodenal diverticula noted in the transverse portion of the duodenum. Proximal small bowel patent. IMPRESSION: 1. Tertiary esophageal contractions consistent with presbyesophagus. Mild pliable distal esophageal narrowing. This may be secondary to mild spasm/esophagitis. Small hiatal hernia. Moderate gastroesophageal reflux. 2. No evidence of gastric or duodenal mass. No evidence of ulceration. Two small duodenal diverticulum noted. Electronically Signed   By: Maisie Fus  Register   On: 02/04/2021 12:15    Assessment and Plan:   Allison Ballard is a 87 y.o. y/o female has been referred for dysphagia and abnormal barium swallow performed recently which showed features of esophagitis and possible narrowing of the distal part of the esophagus.  History of CVA while in [place].  History very suggestive of esophagitis causing dysphagia from prior acid reflux.  Plan 1.  Increase omeprazole 40 mg twice daily 2.  EGD after cardiac clearance as she has suggest that she has dyspnea on exertion although today she was able to come from the car to the office which is 1 floor up without any issues  I have discussed alternative options, risks & benefits,  which include, but are not limited to, bleeding, infection, perforation,respiratory complication & drug reaction.  The patient agrees with this plan & written consent will be obtained.     Follow up in 4 weeks  Dr Allison Mood MD,MRCP(U.K)

## 2021-02-12 ENCOUNTER — Ambulatory Visit (INDEPENDENT_AMBULATORY_CARE_PROVIDER_SITE_OTHER): Payer: Medicaid Other | Admitting: Cardiology

## 2021-02-12 ENCOUNTER — Encounter: Payer: Self-pay | Admitting: Cardiology

## 2021-02-12 ENCOUNTER — Other Ambulatory Visit: Payer: Self-pay

## 2021-02-12 VITALS — BP 150/78 | HR 67 | Ht 60.0 in | Wt 137.1 lb

## 2021-02-12 DIAGNOSIS — R0600 Dyspnea, unspecified: Secondary | ICD-10-CM

## 2021-02-12 DIAGNOSIS — I10 Essential (primary) hypertension: Secondary | ICD-10-CM | POA: Diagnosis not present

## 2021-02-12 DIAGNOSIS — R0609 Other forms of dyspnea: Secondary | ICD-10-CM

## 2021-02-12 LAB — IRON AND TIBC
Iron Saturation: 24 % (ref 15–55)
Iron: 87 ug/dL (ref 27–139)
Total Iron Binding Capacity: 369 ug/dL (ref 250–450)
UIBC: 282 ug/dL (ref 118–369)

## 2021-02-12 LAB — FERRITIN: Ferritin: 36 ng/mL (ref 15–150)

## 2021-02-12 LAB — B12 AND FOLATE PANEL
Folate: >20 ng/mL (ref >3.0)
Vitamin B-12: 647 pg/mL (ref 232–1245)

## 2021-02-12 LAB — SPECIMEN STATUS REPORT

## 2021-02-12 NOTE — Patient Instructions (Signed)

## 2021-02-12 NOTE — Progress Notes (Signed)
Cardiology Office Note:    Date:  02/12/2021   ID:  Allison Ballard, DOB [birth year], MRN [id number]  PCP:  [name], MD   [facility]  Cardiologist:  No primary care provider on file.  Advanced Practice Provider:  No care team member to display Electrophysiologist:  None       Referring MD: Wyline Mood, MD   Chief Complaint  Patient presents with  . Other    SOB w exertion. Meds reviewed verbally with pt.    History of Present Illness:    Allison Ballard is a 87 y.o. female with a hx of CVA in [place], hypertension who presents due to dyspnea on exertion.  Patient states having worsening shortness of breath with exertion over the past month or so.  Denies edema, endorses palpitations when she walks.  She denies chest pain.  Has a history of CVA/stroke in [place] years ago, dealing with left arm and left leg weakness.  Recently started on blood pressure medicines by primary care provider.  Denies any bleeding issues.  Has the feeling of food getting stuck in her throat, is seeing gastroenterology for this.  EGD is being planned.  Past Medical History:  Diagnosis Date  . Stroke [facility]     History reviewed. No pertinent surgical history.  Current Medications: Current Meds  Medication Sig  . amLODipine (NORVASC) 5 MG tablet Take 1 tablet (5 mg total) by mouth daily.  Marland Kitchen atenolol (TENORMIN) 50 MG tablet Take 1 tablet (50 mg total) by mouth daily.  Marland Kitchen omeprazole (PRILOSEC) 40 MG capsule Take 1 capsule (40 mg total) by mouth daily.     Allergies:   Patient has no known allergies.   Social History   Socioeconomic History  . Marital status: Married    Spouse name: Not on file  . Number of children: Not on file  . Years of education: Not on file  . Highest education level: Not on file  Occupational History  . Not on file  Tobacco Use  . Smoking status: Never Smoker  . Smokeless tobacco: Never Used  Substance and Sexual Activity  . Alcohol use:  Never  . Drug use: Never  . Sexual activity: Not on file  Other Topics Concern  . Not on file  Social History Narrative  . Not on file   Social Determinants of Health   Financial Resource Strain: Not on file  Food Insecurity: Not on file  Transportation Needs: Not on file  Physical Activity: Not on file  Stress: Not on file  Social Connections: Not on file     Family History: The patient's family history is not on file.  ROS:   Please see the history of present illness.     All other systems reviewed and are negative.  EKGs/Labs/Other Studies Reviewed:    The following studies were reviewed today:   EKG:  EKG is  ordered today.  The ekg ordered today demonstrates   Recent Labs: 02/09/2021: ALT 19; BUN 14; Creatinine, Ser 0.88; Hemoglobin 11.0; Platelets 285; Potassium 5.1; Sodium 132; TSH 2.260  Recent Lipid Panel    Component Value Date/Time   CHOL 195 02/09/2021 0910   TRIG 51 02/09/2021 0910   HDL 79 02/09/2021 0910   LDLCALC 106 (H) 02/09/2021 0910     Risk Assessment/Calculations:      Physical Exam:    VS:  BP (!) 150/78 (BP Location: Right Arm, Patient Position: Sitting, Cuff Size: Normal)  Pulse 67   Ht 5' (1.524 m)   Wt 137 lb 2 oz (62.2 kg)   SpO2 98%   BMI 26.78 kg/m     Wt Readings from Last 3 Encounters:  02/12/21 137 lb 2 oz (62.2 kg)  02/10/21 137 lb (62.1 kg)  01/28/21 136 lb 12.8 oz (62.1 kg)     GEN:  Well nourished, well developed in no acute distress HEENT: Normal NECK: No JVD; No carotid bruits LYMPHATICS: No lymphadenopathy CARDIAC: RRR, no murmurs, rubs, gallops RESPIRATORY: Decreased breath sounds at left lung base. ABDOMEN: Soft, non-tender, non-distended MUSCULOSKELETAL:  No edema; left, left leg weakness. SKIN: Warm and dry NEUROLOGIC:  Alert and oriented x 3 PSYCHIATRIC:  Normal affect   ASSESSMENT:    1. Dyspnea on exertion   2. Primary hypertension    PLAN:    In order of problems listed  above:  1. Patient with worsening dyspnea on exertion.  Appears euvolemic, no edema.  Get echocardiogram to evaluate systolic and diastolic function.  May consider additional testing based on echocardiogram results. 2. Hypertension, BP elevated, BP meds recently started.  If elevated at follow-up visit, will consider switching atenolol to lisinopril/losartan.  Follow-up after echocardiogram    Medication Adjustments/Labs and Tests Ordered: Current medicines are reviewed at length with the patient today.  Concerns regarding medicines are outlined above.  Orders Placed This Encounter  Procedures  . EKG 12-Lead  . ECHOCARDIOGRAM COMPLETE   No orders of the defined types were placed in this encounter.   Patient Instructions  Medication Instructions:  Your physician recommends that you continue on your current medications as directed. Please refer to the Current Medication list given to you today.  *If you need a refill on your cardiac medications before your next appointment, please call your pharmacy*   Lab Work: None ordered  If you have labs (blood work) drawn today and your tests are completely normal, you will receive your results only by: Marland Kitchen MyChart Message (if you have MyChart) OR . A paper copy in the mail If you have any lab test that is abnormal or we need to change your treatment, we will call you to review the results.   Testing/Procedures:  1.  Your physician has requested that you have an echocardiogram. Echocardiography is a painless test that uses sound waves to create images of your heart. It provides your doctor with information about the size and shape of your heart and how well your heart's chambers and valves are working. This procedure takes approximately one hour. There are no restrictions for this procedure.     Follow-Up: At [facility], you and your health needs are our priority.  As part of our continuing mission to provide you with exceptional  heart care, we have created designated Provider Care Teams.  These Care Teams include your primary Cardiologist (physician) and Advanced Practice Providers (APPs -  Physician Assistants and Nurse Practitioners) who all work together to provide you with the care you need, when you need it.  We recommend signing up for the patient portal called "MyChart".  Sign up information is provided on this After Visit Summary.  MyChart is used to connect with patients for Virtual Visits (Telemedicine).  Patients are able to view lab/test results, encounter notes, upcoming appointments, etc.  Non-urgent messages can be sent to your provider as well.   To learn more about what you can do with MyChart, go to [email or web address].    Your next appointment:  Follow up after Echo   The format for your next appointment:   In Person  Provider:   Debbe Odea, MD   Other Instructions      Signed, Debbe Odea, MD  02/12/2021 4:21 PM    [facility]

## 2021-02-24 ENCOUNTER — Telehealth: Payer: Self-pay

## 2021-02-24 NOTE — Telephone Encounter (Signed)
Spoke with family, advised them that we cannot do their testing until they bring a copy of the correct insurance. JS

## 2021-03-03 ENCOUNTER — Other Ambulatory Visit: Payer: Medicaid Other

## 2021-03-04 ENCOUNTER — Ambulatory Visit: Payer: Medicaid Other | Admitting: Hospice and Palliative Medicine

## 2021-03-04 ENCOUNTER — Other Ambulatory Visit: Payer: Medicaid Other | Attending: Gastroenterology

## 2021-03-08 ENCOUNTER — Encounter: Admission: RE | Payer: Self-pay | Source: Home / Self Care

## 2021-03-08 ENCOUNTER — Ambulatory Visit: Payer: Medicaid Other | Admitting: Hospice and Palliative Medicine

## 2021-03-08 ENCOUNTER — Other Ambulatory Visit: Payer: Medicaid Other

## 2021-03-08 ENCOUNTER — Ambulatory Visit: Admission: RE | Admit: 2021-03-08 | Payer: Medicaid Other | Source: Home / Self Care | Admitting: Gastroenterology

## 2021-03-08 SURGERY — ESOPHAGOGASTRODUODENOSCOPY (EGD) WITH PROPOFOL
Anesthesia: General

## 2021-03-10 ENCOUNTER — Other Ambulatory Visit: Payer: Self-pay | Admitting: Hospice and Palliative Medicine

## 2021-03-10 DIAGNOSIS — R0609 Other forms of dyspnea: Secondary | ICD-10-CM

## 2021-03-10 DIAGNOSIS — R0600 Dyspnea, unspecified: Secondary | ICD-10-CM

## 2021-03-12 ENCOUNTER — Ambulatory Visit: Payer: Medicaid Other | Admitting: Cardiology

## 2021-03-24 ENCOUNTER — Other Ambulatory Visit: Payer: Medicaid Other

## 2021-03-26 ENCOUNTER — Ambulatory Visit (INDEPENDENT_AMBULATORY_CARE_PROVIDER_SITE_OTHER): Payer: Medicaid Other | Admitting: Hospice and Palliative Medicine

## 2021-03-26 ENCOUNTER — Encounter: Payer: Self-pay | Admitting: Hospice and Palliative Medicine

## 2021-03-26 ENCOUNTER — Other Ambulatory Visit: Payer: Self-pay

## 2021-03-26 VITALS — BP 112/78 | HR 65 | Temp 97.1°F | Resp 16 | Ht 63.0 in | Wt 134.2 lb

## 2021-03-26 DIAGNOSIS — R1310 Dysphagia, unspecified: Secondary | ICD-10-CM | POA: Diagnosis not present

## 2021-03-26 DIAGNOSIS — M79604 Pain in right leg: Secondary | ICD-10-CM | POA: Diagnosis not present

## 2021-03-26 DIAGNOSIS — I739 Peripheral vascular disease, unspecified: Secondary | ICD-10-CM | POA: Diagnosis not present

## 2021-03-26 DIAGNOSIS — M79605 Pain in left leg: Secondary | ICD-10-CM

## 2021-03-26 DIAGNOSIS — R0609 Other forms of dyspnea: Secondary | ICD-10-CM

## 2021-03-26 DIAGNOSIS — I10 Essential (primary) hypertension: Secondary | ICD-10-CM

## 2021-03-26 DIAGNOSIS — R0600 Dyspnea, unspecified: Secondary | ICD-10-CM

## 2021-03-26 MED ORDER — OMEPRAZOLE 40 MG PO CPDR: 40.0000 mg | DELAYED_RELEASE_CAPSULE | Freq: Every day | ORAL | 0 refills | Status: DC

## 2021-03-26 MED ORDER — AMLODIPINE BESYLATE 5 MG PO TABS: 5.0000 mg | ORAL_TABLET | Freq: Every day | ORAL | 0 refills | Status: DC

## 2021-03-26 MED ORDER — ATENOLOL 50 MG PO TABS: 50.0000 mg | ORAL_TABLET | Freq: Every day | ORAL | 0 refills | Status: DC

## 2021-03-26 NOTE — Progress Notes (Signed)
[facility] [address] [place], [place] [address]  Internal MEDICINE  Office Visit Note  Patient Name: Allison Ballard  [id number]  [id number]  Date of Service: 03/28/2021  Chief Complaint  Patient presents with  . Follow-up    review labs,ugi,has not had echo, pt leaving on 03/30/21 out of the country will return in a year, discuss meds   . Quality Metric Gaps    dexa    HPI Patient is here for routine follow-up UGI with tertiary esophageal contractions consistent with presbyesophagus, has been seen by GI who recommended upper endoscopy Requested cardiac clearance prior to procedure due to complaints of dyspnea with exertion Scheduled for echocardiogram Unfortunately testing and further work-up will not be completed as she is traveling to [place] next week for the next year Plans to follow-up with her providers in [place] to complete work-up  Continues to have issues with swallowing Shortness of breath has seemed to improve, cardiologist recommended echocardiogram for clearance  Complaining of bilateral lower extremity pain--worse with ambulation, denies redness or swelling    Current Medication: Outpatient Encounter Medications as of 03/26/2021  Medication Sig  . [DISCONTINUED] amLODipine (NORVASC) 5 MG tablet Take 1 tablet (5 mg total) by mouth daily.  . [DISCONTINUED] atenolol (TENORMIN) 50 MG tablet Take 1 tablet (50 mg total) by mouth daily.  . [DISCONTINUED] omeprazole (PRILOSEC) 40 MG capsule Take 1 capsule (40 mg total) by mouth daily.  Marland Kitchen amLODipine (NORVASC) 5 MG tablet Take 1 tablet (5 mg total) by mouth daily.  Marland Kitchen atenolol (TENORMIN) 50 MG tablet Take 1 tablet (50 mg total) by mouth daily.  Marland Kitchen omeprazole (PRILOSEC) 40 MG capsule Take 1 capsule (40 mg total) by mouth daily.   No facility-administered encounter medications on file as of 03/26/2021.    Surgical History: History reviewed. No pertinent surgical history.  Medical History: Past Medical  History:  Diagnosis Date  . Stroke [facility])     Family History: Family History  Family history unknown: Yes    Social History   Socioeconomic History  . Marital status: Married    Spouse name: Not on file  . Number of children: Not on file  . Years of education: Not on file  . Highest education level: Not on file  Occupational History  . Not on file  Tobacco Use  . Smoking status: Never Smoker  . Smokeless tobacco: Never Used  Substance and Sexual Activity  . Alcohol use: Never  . Drug use: Never  . Sexual activity: Not on file  Other Topics Concern  . Not on file  Social History Narrative  . Not on file   Social Determinants of Health   Financial Resource Strain: Not on file  Food Insecurity: Not on file  Transportation Needs: Not on file  Physical Activity: Not on file  Stress: Not on file  Social Connections: Not on file  Intimate Partner Violence: Not on file      Review of Systems  Constitutional: Negative for chills, diaphoresis and fatigue.  HENT: Negative for ear pain, postnasal drip and sinus pressure.   Eyes: Negative for photophobia, discharge, redness, itching and visual disturbance.  Respiratory: Positive for shortness of breath. Negative for cough and wheezing.   Cardiovascular: Negative for chest pain, palpitations and leg swelling.  Gastrointestinal: Negative for abdominal pain, constipation, diarrhea, nausea and vomiting.       Difficulty swallowing  Genitourinary: Negative for dysuria and flank pain.  Musculoskeletal: Negative for arthralgias, back pain, gait problem  and neck pain.       Bilateral lower extremity pain  Skin: Negative for color change.  Allergic/Immunologic: Negative for environmental allergies and food allergies.  Neurological: Negative for dizziness and headaches.  Hematological: Does not bruise/bleed easily.  Psychiatric/Behavioral: Negative for agitation, behavioral problems (depression) and hallucinations.    Vital  Signs: BP 112/78 Comment: 126/78  Pulse 65   Temp (!) 97.1 F (36.2 C)   Resp 16   Ht 5\' 3"  (1.6 m)   Wt 134 lb 3.2 oz (60.9 kg)   SpO2 97%   BMI 23.77 kg/m    Physical Exam Vitals reviewed.  Constitutional:      Appearance: Normal appearance. She is normal weight.  Cardiovascular:     Rate and Rhythm: Normal rate and regular rhythm.     Pulses: Normal pulses.     Heart sounds: Normal heart sounds.  Pulmonary:     Effort: Pulmonary effort is normal.     Breath sounds: Normal breath sounds.  Abdominal:     General: Abdomen is flat.     Palpations: Abdomen is soft.  Musculoskeletal:        General: Normal range of motion.     Cervical back: Normal range of motion.  Skin:    General: Skin is warm.  Neurological:     General: No focal deficit present.     Mental Status: She is alert and oriented to person, place, and time. Mental status is at baseline.  Psychiatric:        Mood and Affect: Mood normal.        Behavior: Behavior normal.        Thought Content: Thought content normal.        Judgment: Judgment normal.    Assessment/Plan: 1. Essential hypertension BP and HR well controlled, prefers medications she is put on while in - amLODipine (NORVASC) 5 MG tablet; Take 1 tablet (5 mg total) by mouth daily.  Dispense: 90 tablet; Refill: 0 - atenolol (TENORMIN) 50 MG tablet; Take 1 tablet (50 mg total) by mouth daily.  Dispense: 90 tablet; Refill: 0  2. PVD (peripheral vascular disease) with claudication (HCC) Evidence of PVD with abnormal ABI--will need further vascular work-up while in [place] Advised to wear compression stocking on plane ride - POCT ABI Screening Pilot No Charge  3. Dysphagia, unspecified type Continue with omeprazole, discussed safe swallowing practices Will need upper GI scope while in [place] - omeprazole (PRILOSEC) 40 MG capsule; Take 1 capsule (40 mg total) by mouth daily.  Dispense: 90 capsule; Refill: 0  4. Dyspnea on exertion Will  need echocardiogram while in [place] for further evaluation  General Counseling: [name] verbalizes understanding of the findings of todays visit and agrees with plan of treatment. I have discussed any further diagnostic evaluation that may be needed or ordered today. We also reviewed her medications today. she has been encouraged to call the office with any questions or concerns that should arise related to todays visit.    Orders Placed This Encounter  Procedures  . POCT ABI Screening Pilot No Charge    Meds ordered this encounter  Medications  . amLODipine (NORVASC) 5 MG tablet    Sig: Take 1 tablet (5 mg total) by mouth daily.    Dispense:  90 tablet    Refill:  0  . atenolol (TENORMIN) 50 MG tablet    Sig: Take 1 tablet (50 mg total) by mouth daily.    Dispense:  90  tablet    Refill:  0  . omeprazole (PRILOSEC) 40 MG capsule    Sig: Take 1 capsule (40 mg total) by mouth daily.    Dispense:  90 capsule    Refill:  0    Time spent: 30 Minutes Time spent includes review of chart, medications, test results and follow-up plan with the patient.  This patient was seen by Leeanne Deed AGNP-C in Collaboration with Dr [name] as a part of collaborative care agreement     Lubertha Basque. Lacole Komorowski AGNP-C Internal medicine

## 2021-03-28 ENCOUNTER — Encounter: Payer: Self-pay | Admitting: Hospice and Palliative Medicine

## 2021-03-29 ENCOUNTER — Other Ambulatory Visit: Payer: Self-pay

## 2021-03-29 DIAGNOSIS — R1310 Dysphagia, unspecified: Secondary | ICD-10-CM

## 2021-03-29 DIAGNOSIS — I10 Essential (primary) hypertension: Secondary | ICD-10-CM

## 2021-03-29 MED ORDER — ATENOLOL 50 MG PO TABS: 50.0000 mg | ORAL_TABLET | Freq: Every day | ORAL | 1 refills | Status: DC

## 2021-03-29 MED ORDER — AMLODIPINE BESYLATE 5 MG PO TABS: 5.0000 mg | ORAL_TABLET | Freq: Every day | ORAL | 1 refills | Status: DC

## 2021-03-29 MED ORDER — OMEPRAZOLE 40 MG PO CPDR: 40.0000 mg | DELAYED_RELEASE_CAPSULE | Freq: Every day | ORAL | 1 refills | Status: DC

## 2021-03-31 ENCOUNTER — Encounter: Payer: Self-pay | Admitting: Gastroenterology

## 2021-03-31 ENCOUNTER — Ambulatory Visit: Payer: Medicaid Other | Admitting: Gastroenterology

## 2021-03-31 ENCOUNTER — Telehealth: Payer: Self-pay

## 2021-03-31 ENCOUNTER — Ambulatory Visit: Payer: Medicaid Other | Admitting: Hospice and Palliative Medicine

## 2021-03-31 NOTE — Telephone Encounter (Signed)
Per Dr. Tobi Bastos: pt cancelled procedure and never rescheduled. He said she does not need to be seen today in office. LVM to call office back to schedule procedure.

## 2021-04-25 ENCOUNTER — Other Ambulatory Visit: Payer: Self-pay | Admitting: Hospice and Palliative Medicine

## 2021-04-25 DIAGNOSIS — I10 Essential (primary) hypertension: Secondary | ICD-10-CM

## 2021-05-24 ENCOUNTER — Telehealth: Payer: Self-pay

## 2021-05-24 NOTE — Telephone Encounter (Signed)
Lmom to reschedule cancelled ultrasound. Allison Ballard 

## 2022-10-20 IMAGING — RF DG UGI W SINGLE CM
11 series · 11 of 11 positions shown · non-contrast
Comparison: No prior.

CLINICAL DATA: Dysphagia.  History of CVA.

EXAM:
UPPER GI SERIES WITHOUT KUB
TECHNIQUE: Routine upper GI series was performed with thin density barium.
FLUOROSCOPY TIME:  Fluoroscopy Time:  1 minutes 12 seconds
Radiation Exposure Index (if provided by the fluoroscopic device):
31.7 mGy

[Series 1: fluoro_barium singleshot_bw · 0.17mm/px · 1 of 1 slices shown (1 of 11)]
[im 1/1]
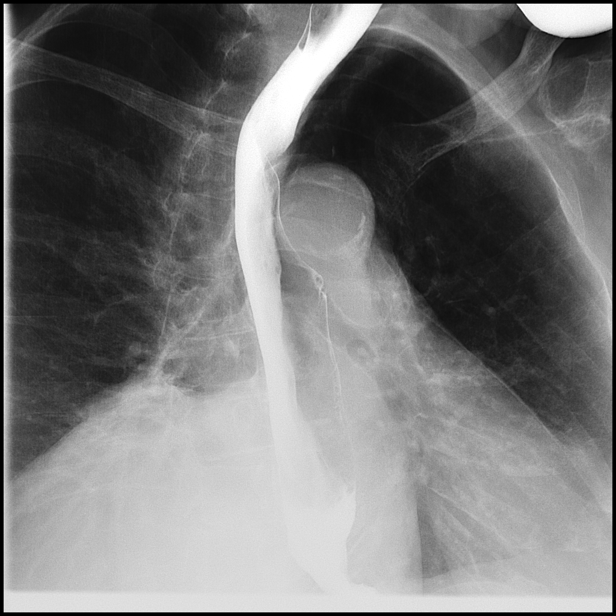

[Series 2: fluoro_barium singleshot_bw · 0.17mm/px · 1 of 1 slices shown (2 of 11)]
[im 1/1]
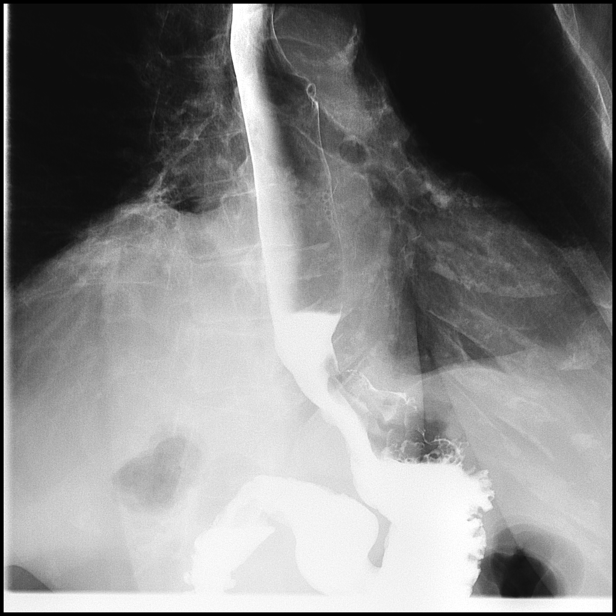

[Series 3: fluoro_barium singleshot_bw · 0.17mm/px · 1 of 1 slices shown (3 of 11)]
[im 1/1]
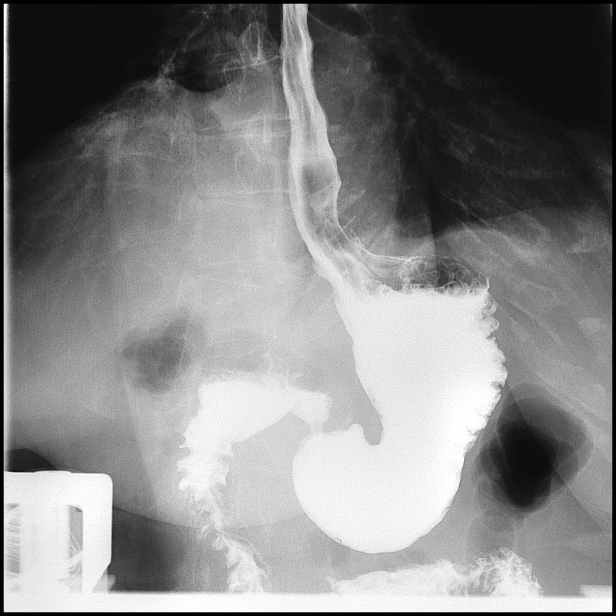

[Series 4: fluoro_barium singleshot_bw · 0.22mm/px · 1 of 1 slices shown (4 of 11)]
[im 1/1]
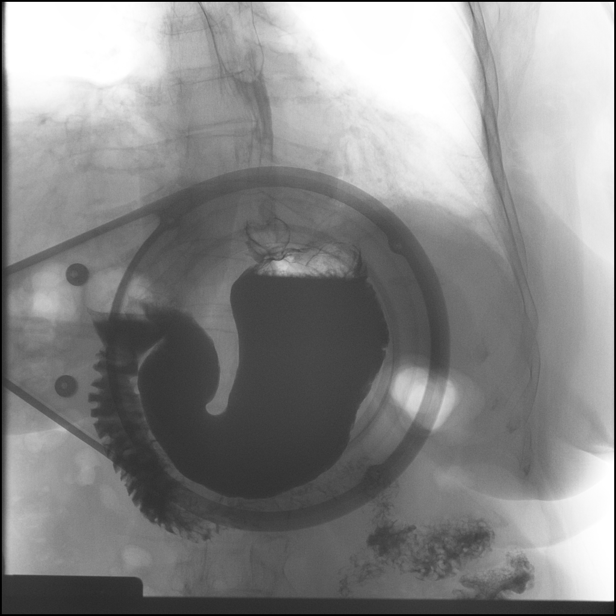

[Series 5: fluoro_barium singleshot_bw · 0.20mm/px · 1 of 1 slices shown (5 of 11)]
[im 1/1]
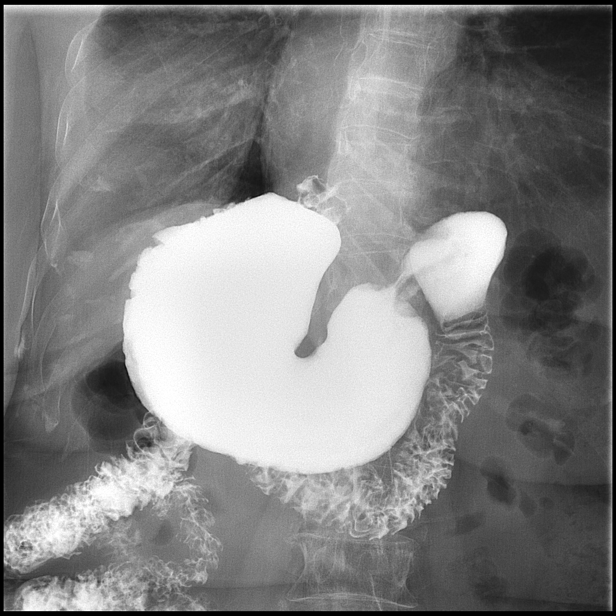

[Series 6: fluoro_barium singleshot_bw · 0.19mm/px · 1 of 1 slices shown (6 of 11)]
[im 1/1]
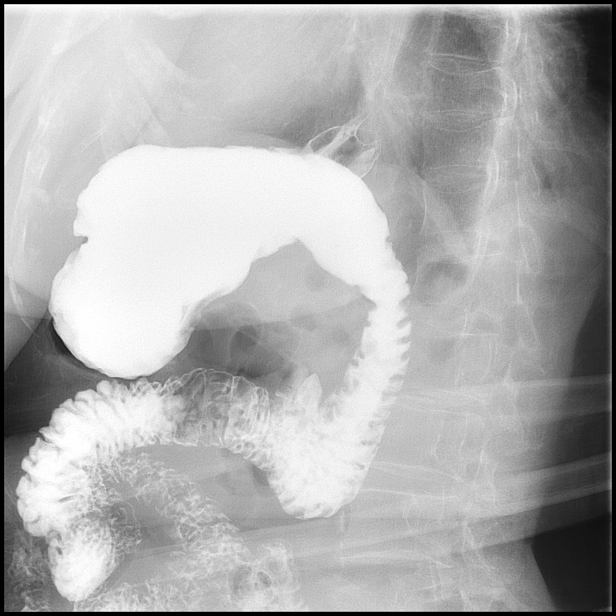

[Series 7: fluoro_barium singleshot_bw · 0.20mm/px · 1 of 1 slices shown (7 of 11)]
[im 1/1]
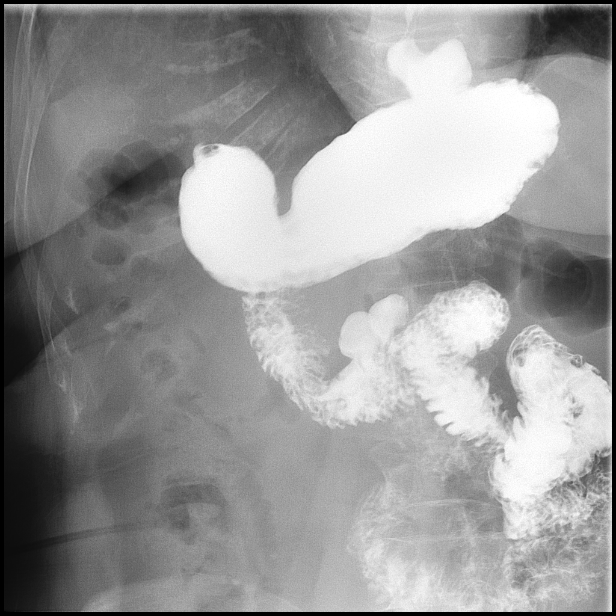

[Series 8: fluoro_barium singleshot_bw · 0.20mm/px · 1 of 1 slices shown (8 of 11)]
[im 1/1]
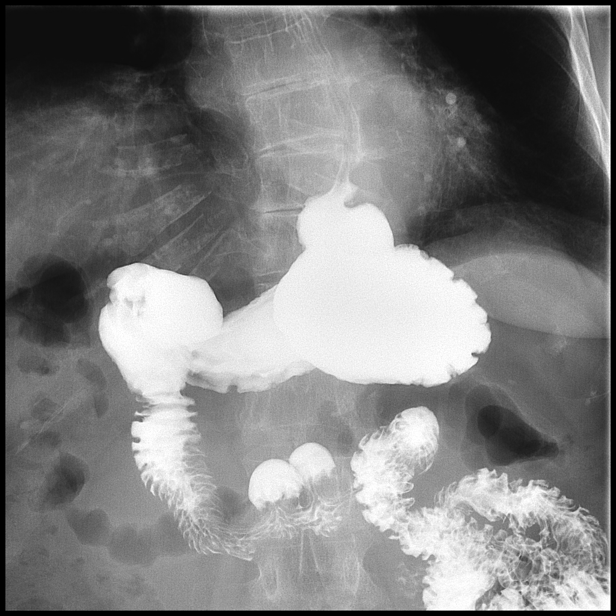

[Series 9: fluoro_barium singleshot_bw · 0.20mm/px · 1 of 1 slices shown (9 of 11)]
[im 1/1]
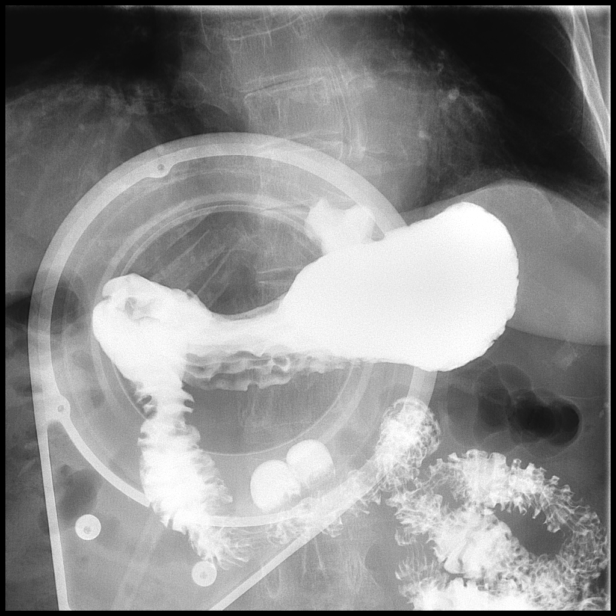

[Series 10: fluoro_barium singleshot_bw · 0.20mm/px · 1 of 1 slices shown (10 of 11)]
[im 1/1]
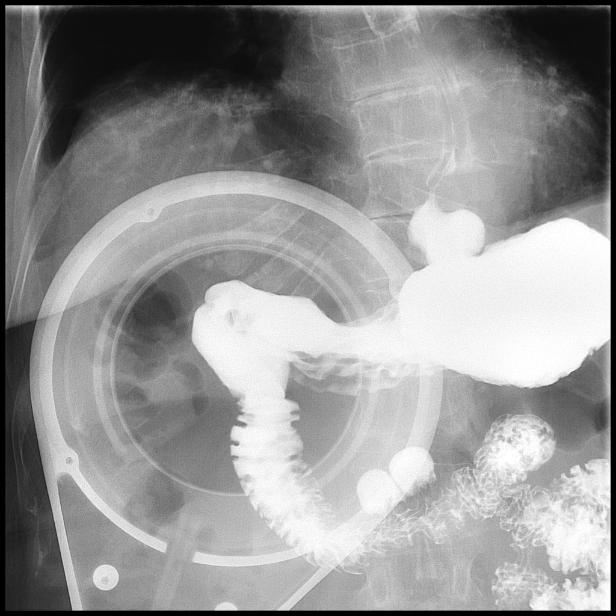

[Series 11: fluoro_barium singleshot_bw · 0.20mm/px · 1 of 1 slices shown (11 of 11)]
[im 1/1]
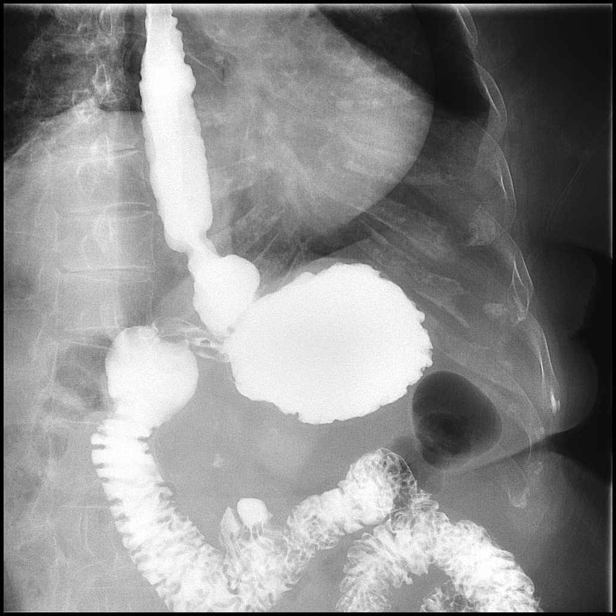

[11 of 11 positions shown; findings below may reference images not displayed]

FINDINGS: Tertiary esophageal contractions consistent with presbyesophagus.
Mild pliable distal esophageal narrowing. This may be secondary to
mild spasm/esophagitis. Small hiatal hernia. Moderate reflux.
Stomach and duodenum are widely patent. No mass lesion. No evidence
of ulceration. Two duodenal diverticula noted in the transverse
portion of the duodenum. Proximal small bowel patent.
IMPRESSION: 1. Tertiary esophageal contractions consistent with presbyesophagus.
Mild pliable distal esophageal narrowing. This may be secondary to
mild spasm/esophagitis. Small hiatal hernia. Moderate
gastroesophageal reflux.

2. No evidence of gastric or duodenal mass. No evidence of
ulceration. Two small duodenal diverticulum noted.

## 2024-05-09 ENCOUNTER — Encounter: Payer: Self-pay | Admitting: Nurse Practitioner

## 2024-05-09 ENCOUNTER — Ambulatory Visit: Admitting: Nurse Practitioner

## 2024-05-09 VITALS — BP 136/88 | HR 60 | Temp 98.1°F | Resp 16 | Ht 63.0 in | Wt 118.8 lb

## 2024-05-09 DIAGNOSIS — E559 Vitamin D deficiency, unspecified: Secondary | ICD-10-CM | POA: Diagnosis not present

## 2024-05-09 DIAGNOSIS — M25552 Pain in left hip: Secondary | ICD-10-CM | POA: Diagnosis not present

## 2024-05-09 DIAGNOSIS — E782 Mixed hyperlipidemia: Secondary | ICD-10-CM | POA: Diagnosis not present

## 2024-05-09 DIAGNOSIS — M25551 Pain in right hip: Secondary | ICD-10-CM

## 2024-05-09 DIAGNOSIS — G8929 Other chronic pain: Secondary | ICD-10-CM

## 2024-05-09 DIAGNOSIS — Z0000 Encounter for general adult medical examination without abnormal findings: Secondary | ICD-10-CM

## 2024-05-09 MED ORDER — TRAMADOL HCL 50 MG PO TABS: 50.0000 mg | ORAL_TABLET | Freq: Four times a day (QID) | ORAL | 0 refills | Status: DC | PRN

## 2024-05-09 NOTE — Progress Notes (Signed)
 [facility] [address] [place], [place] [address]  Internal MEDICINE  Office Visit Note  Patient Name: Allison Ballard  [id number]  [id number]  Date of Service: 05/09/2024   Complaints/HPI Pt is here for establishment of PCP. Chief Complaint  Patient presents with   New Patient (Initial Visit)    Bilateral leg pain. Arm pain    HPI Allison Ballard presents for a new patient visit to establish care.  Well-appearing 89 y.o. female with hypertension and GERD but is not taking any medication for this. BP is stable today. Work: retired  Home: lives at home with husband  Diet: fair  Exercise: walking  Tobacco use: none  Alcohol use: none  Illicit drug use: none  Labs:  due for routine labs  New or worsening pain: Bilateral chronic hip pain, left is worse than right. Has been experiencing thisfor many years, does radiate down thelegs, no low back pain. Mostly notices the pain at night when laying in bed. Pain during the day is limited.   Current Medication: Outpatient Encounter Medications as of 05/09/2024  Medication Sig   traMADol  (ULTRAM ) 50 MG tablet Take 1 tablet (50 mg total) by mouth every 6 (six) hours as needed for up to 5 days for moderate pain (pain score 4-6) or severe pain (pain score 7-10).   amLODipine  (NORVASC ) 5 MG tablet TAKE 1 TABLET(5 MG) BY MOUTH DAILY (Patient not taking: Reported on 05/09/2024)   atenolol  (TENORMIN ) 50 MG tablet TAKE 1 TABLET(50 MG) BY MOUTH DAILY (Patient not taking: Reported on 05/09/2024)   omeprazole  (PRILOSEC) 40 MG capsule Take 1 capsule (40 mg total) by mouth daily. (Patient not taking: Reported on 05/09/2024)   No facility-administered encounter medications on file as of 05/09/2024.    Surgical History: History reviewed. No pertinent surgical history.  Medical History: Past Medical History:  Diagnosis Date   Stroke [facility])     Family History: Family History  Family history unknown: Yes    Social History   Socioeconomic  History   Marital status: Married    Spouse name: Not on file   Number of children: Not on file   Years of education: Not on file   Highest education level: Not on file  Occupational History   Not on file  Tobacco Use   Smoking status: Never   Smokeless tobacco: Never  Substance and Sexual Activity   Alcohol use: Never   Drug use: Never   Sexual activity: Not on file  Other Topics Concern   Not on file  Social History Narrative   Not on file   Social Drivers of Health   Financial Resource Strain: Not on file  Food Insecurity: Not on file  Transportation Needs: Not on file  Physical Activity: Not on file  Stress: Not on file  Social Connections: Not on file  Intimate Partner Violence: Not on file     Review of Systems  Constitutional:  Positive for fatigue. Negative for chills and fever.  HENT: Negative.    Respiratory: Negative.  Negative for cough, chest tightness, shortness of breath and wheezing.   Cardiovascular: Negative.  Negative for chest pain and palpitations.  Gastrointestinal: Negative.   Genitourinary: Negative.   Musculoskeletal:  Positive for arthralgias, back pain, gait problem and myalgias.  Neurological:  Positive for weakness.  Psychiatric/Behavioral: Negative.      Vital Signs: BP 136/88   Pulse 60   Temp 98.1 F (36.7 C)   Resp 16   Ht 5' 3 (  1.6 m)   Wt 118 lb 12.8 oz (53.9 kg)   SpO2 98%   BMI 21.04 kg/m    Physical Exam Vitals reviewed.  Constitutional:      General: She is not in acute distress.    Appearance: Normal appearance. She is normal weight. She is not ill-appearing.  HENT:     Head: Normocephalic and atraumatic.   Eyes:     Pupils: Pupils are equal, round, and reactive to light.    Cardiovascular:     Rate and Rhythm: Normal rate and regular rhythm.  Pulmonary:     Effort: Pulmonary effort is normal. No respiratory distress.   Skin:    Capillary Refill: Capillary refill takes less than 2 seconds.    Neurological:     Mental Status: She is alert and oriented to person, place, and time.   Psychiatric:        Mood and Affect: Mood normal.        Behavior: Behavior normal.       Assessment/Plan: 1. Chronic hip pain, bilateral (Primary) Referred to orthopedic surgery, routine labs ordered  - CBC with Differential/Platelet - CMP14+EGFR - Lipid Profile - Vitamin D (25 hydroxy) - Ambulatory referral to Orthopedic Surgery  2. Mixed hyperlipidemia Routine labs ordered  - CBC with Differential/Platelet - CMP14+EGFR - Lipid Profile - Vitamin D (25 hydroxy)  3. Vitamin D deficiency Routine labs ordered - CBC with Differential/Platelet - CMP14+EGFR - Lipid Profile - Vitamin D (25 hydroxy)  4. Routine health maintenance Routine labs ordered  - CBC with Differential/Platelet - CMP14+EGFR - Lipid Profile - Vitamin D (25 hydroxy)    General Counseling: Allison Ballard verbalizes understanding of the findings of todays visit and agrees with plan of treatment. I have discussed any further diagnostic evaluation that may be needed or ordered today. We also reviewed her medications today. she has been encouraged to call the office with any questions or concerns that should arise related to todays visit.    Orders Placed This Encounter  Procedures   CBC with Differential/Platelet   CMP14+EGFR   Lipid Profile   Vitamin D (25 hydroxy)   Ambulatory referral to Orthopedic Surgery    Meds ordered this encounter  Medications   traMADol  (ULTRAM ) 50 MG tablet    Sig: Take 1 tablet (50 mg total) by mouth every 6 (six) hours as needed for up to 5 days for moderate pain (pain score 4-6) or severe pain (pain score 7-10).    Dispense:  20 tablet    Refill:  0    Fill new script today    Return for CPE, Allison Ballard PCP at earliest available opening, have labs done prior to visit. .  Time spent:30 Minutes Time spent with patient included reviewing progress notes, labs, imaging studies, and  discussing plan for follow up.   [place] Controlled Substance Database was reviewed by me for overdose risk score (ORS)   This patient was seen by Laurence Pons, FNP-C in collaboration with Dr. Verneta Gone as a part of collaborative care agreement.   Alexzandra Bilton R. Bobbi Burow, MSN, FNP-C Internal Medicine

## 2024-05-10 ENCOUNTER — Telehealth: Payer: Self-pay | Admitting: Nurse Practitioner

## 2024-05-10 NOTE — Telephone Encounter (Signed)
 Awaiting 05/09/24 office notes for Orthopedic Surgery referral-Toni

## 2024-05-27 ENCOUNTER — Encounter: Admitting: Nurse Practitioner

## 2024-05-30 ENCOUNTER — Encounter: Admitting: Nurse Practitioner

## 2024-05-31 ENCOUNTER — Other Ambulatory Visit: Payer: Self-pay | Admitting: Nurse Practitioner

## 2024-05-31 ENCOUNTER — Telehealth: Payer: Self-pay

## 2024-05-31 DIAGNOSIS — G8929 Other chronic pain: Secondary | ICD-10-CM

## 2024-06-03 MED ORDER — TRAMADOL HCL 50 MG PO TABS: 50.0000 mg | ORAL_TABLET | Freq: Four times a day (QID) | ORAL | 0 refills | Status: DC | PRN

## 2024-06-03 NOTE — Telephone Encounter (Signed)
Tramadol refill ordered.

## 2024-06-04 ENCOUNTER — Telehealth: Payer: Self-pay | Admitting: Nurse Practitioner

## 2024-06-04 NOTE — Telephone Encounter (Signed)
Orthopedic referral sent via [facility] to [facility]. Notified patient. Gave pt telephone # [phone]

## 2024-06-05 ENCOUNTER — Telehealth: Payer: Self-pay | Admitting: Nurse Practitioner

## 2024-06-05 NOTE — Telephone Encounter (Signed)
 Orthopedic referral appointment 06/07/2024 @ [facility]-Toni

## 2024-06-17 ENCOUNTER — Telehealth: Payer: Self-pay | Admitting: Nurse Practitioner

## 2024-06-17 NOTE — Telephone Encounter (Signed)
 Left vm to confirm 06/24/24 appointment-Toni

## 2024-06-24 ENCOUNTER — Encounter: Admitting: Nurse Practitioner

## 2024-06-27 ENCOUNTER — Telehealth: Payer: Self-pay | Admitting: Nurse Practitioner

## 2024-06-27 NOTE — Telephone Encounter (Signed)
 Attempted to contact grandson to r/s 06/24/24 missed appointment No answer.MB full-Toni

## 2024-07-04 ENCOUNTER — Other Ambulatory Visit: Payer: Self-pay | Admitting: Nurse Practitioner

## 2024-07-04 DIAGNOSIS — G8929 Other chronic pain: Secondary | ICD-10-CM

## 2024-07-04 MED ORDER — TRAMADOL HCL 50 MG PO TABS: 50.0000 mg | ORAL_TABLET | Freq: Four times a day (QID) | ORAL | 2 refills | Status: DC | PRN

## 2024-07-09 LAB — CBC WITH DIFFERENTIAL/PLATELET
Basophils Absolute: 0 x10E3/uL (ref 0.0–0.2)
Basos: 1 %
EOS (ABSOLUTE): 0.3 x10E3/uL (ref 0.0–0.4)
Eos: 6 %
Hematocrit: 33.7 % — ABNORMAL LOW (ref 34.0–46.6)
Hemoglobin: 11 g/dL — ABNORMAL LOW (ref 11.1–15.9)
Immature Grans (Abs): 0 x10E3/uL (ref 0.0–0.1)
Immature Granulocytes: 0 %
Lymphocytes Absolute: 1.3 x10E3/uL (ref 0.7–3.1)
Lymphs: 26 %
MCH: 29.6 pg (ref 26.6–33.0)
MCHC: 32.6 g/dL (ref 31.5–35.7)
MCV: 91 fL (ref 79–97)
Monocytes Absolute: 0.6 x10E3/uL (ref 0.1–0.9)
Monocytes: 12 %
Neutrophils Absolute: 2.8 x10E3/uL (ref 1.4–7.0)
Neutrophils: 55 %
Platelets: 279 x10E3/uL (ref 150–450)
RBC: 3.71 x10E6/uL — ABNORMAL LOW (ref 3.77–5.28)
RDW: 12.9 % (ref 11.7–15.4)
WBC: 5 x10E3/uL (ref 3.4–10.8)

## 2024-07-09 LAB — CMP14+EGFR
ALT: 14 IU/L (ref 0–32)
AST: 17 IU/L (ref 0–40)
Albumin: 3.7 g/dL (ref 3.7–4.7)
Alkaline Phosphatase: 84 IU/L (ref 44–121)
BUN/Creatinine Ratio: 16 (ref 12–28)
BUN: 16 mg/dL (ref 8–27)
Bilirubin Total: 0.3 mg/dL (ref 0.0–1.2)
CO2: 22 mmol/L (ref 20–29)
Calcium: 9.5 mg/dL (ref 8.7–10.3)
Chloride: 101 mmol/L (ref 96–106)
Creatinine, Ser: 0.99 mg/dL (ref 0.57–1.00)
Globulin, Total: 2.2 g/dL (ref 1.5–4.5)
Glucose: 84 mg/dL (ref 70–99)
Potassium: 5.1 mmol/L (ref 3.5–5.2)
Sodium: 135 mmol/L (ref 134–144)
Total Protein: 5.9 g/dL — ABNORMAL LOW (ref 6.0–8.5)
eGFR: 55 mL/min/1.73 — ABNORMAL LOW

## 2024-07-09 LAB — LIPID PANEL
Chol/HDL Ratio: 2.8 ratio (ref 0.0–4.4)
Cholesterol, Total: 208 mg/dL — ABNORMAL HIGH (ref 100–199)
HDL: 73 mg/dL
LDL Chol Calc (NIH): 120 mg/dL — ABNORMAL HIGH (ref 0–99)
Triglycerides: 86 mg/dL (ref 0–149)
VLDL Cholesterol Cal: 15 mg/dL (ref 5–40)

## 2024-07-09 LAB — VITAMIN D 25 HYDROXY (VIT D DEFICIENCY, FRACTURES): Vit D, 25-Hydroxy: 27.3 ng/mL — ABNORMAL LOW (ref 30.0–100.0)

## 2024-07-24 ENCOUNTER — Telehealth: Payer: Self-pay | Admitting: Nurse Practitioner

## 2024-07-24 NOTE — Telephone Encounter (Signed)
 Left 2nd  vm regarding missed appointment-Toni

## 2024-08-29 ENCOUNTER — Ambulatory Visit: Admitting: Nurse Practitioner

## 2024-08-29 ENCOUNTER — Encounter: Admitting: Nurse Practitioner

## 2024-08-29 ENCOUNTER — Encounter: Payer: Self-pay | Admitting: Nurse Practitioner

## 2024-08-29 VITALS — BP 136/68 | HR 66 | Temp 97.6°F | Resp 16 | Ht 63.0 in | Wt 117.8 lb

## 2024-08-29 DIAGNOSIS — Z0001 Encounter for general adult medical examination with abnormal findings: Secondary | ICD-10-CM | POA: Diagnosis not present

## 2024-08-29 DIAGNOSIS — E782 Mixed hyperlipidemia: Secondary | ICD-10-CM | POA: Diagnosis not present

## 2024-08-29 DIAGNOSIS — G8929 Other chronic pain: Secondary | ICD-10-CM

## 2024-08-29 DIAGNOSIS — M25551 Pain in right hip: Secondary | ICD-10-CM

## 2024-08-29 DIAGNOSIS — M25552 Pain in left hip: Secondary | ICD-10-CM

## 2024-08-29 DIAGNOSIS — M5416 Radiculopathy, lumbar region: Secondary | ICD-10-CM | POA: Diagnosis not present

## 2024-08-29 MED ORDER — CYCLOBENZAPRINE HCL 5 MG PO TABS: 5.0000 mg | ORAL_TABLET | Freq: Every evening | ORAL | 2 refills | Status: AC | PRN

## 2024-08-29 MED ORDER — TRAMADOL HCL 50 MG PO TABS: 50.0000 mg | ORAL_TABLET | Freq: Every day | ORAL | 2 refills | Status: AC | PRN

## 2024-08-29 NOTE — Progress Notes (Signed)
 [facility] [address] [place], [place] [address]  Internal MEDICINE  Office Visit Note  Patient Name: Allison Ballard  [id number]  [id number]  Date of Service: 08/29/2024  Chief Complaint  Patient presents with   Annual Exam    HPI Allison Ballard presents for an annual well visit and physical exam.  Well-appearing 89 y.o. female with hypertension and GERD but is not taking any medication for this. BP is stable today.  Routine CRC screening: discontinued, aged out Eye exam and/or foot exam: Labs: up to date, lab results reviewed with patient and family. Has mildly elevated LDL.  New or worsening pain: chronic low back pain.  Forgetfulness -- seems to have forgetful moments esp relating to short term memory. Of concern but not frequent enough to need referral yet.  Low back pain and bilateral hip pain -- takes tramadol  as needed. See by Dr. Krasinski. Had a cortisone injection in her left shoulder which has helped with shoulder pain. Prednisone did not improve back pain very much. Dr. Krasinski recommended that she see Dr. Rankin Rogue for injections in her lumbar spine if the pain continued and was not relieved.  Trouble sleeping at night due to low back and hip pain.     Functional Status Survey: Is the patient deaf or have difficulty hearing?: Yes Does the patient have difficulty seeing, even when wearing glasses/contacts?: No Does the patient have difficulty concentrating, remembering, or making decisions?: No Does the patient have difficulty walking or climbing stairs?: Yes Does the patient have difficulty dressing or bathing?: No Does the patient have difficulty doing errands alone such as visiting a doctor's office or shopping?: No     09/22/2018    8:29 AM 07/31/2019   10:08 AM 03/26/2021   10:57 AM 05/09/2024    3:48 PM 08/29/2024   10:47 AM  Fall Risk  Falls in the past year?  1  0 0 0  Was there an injury with Fall?  0  0 0  Fall Risk  Category Calculator  2  0 0  Fall Risk Category (Retired)  Moderate      (RETIRED) Patient Fall Risk Level Low fall risk       Patient at Risk for Falls Due to   No Fall Risks No Fall Risks No Fall Risks  Fall risk Follow up   Falls evaluation completed  Falls evaluation completed Falls evaluation completed     Data saved with a previous flowsheet row definition       08/29/2024   10:47 AM  Depression screen PHQ 2/9  Decreased Interest 0  Down, Depressed, Hopeless 0  PHQ - 2 Score 0      Current Medication: Outpatient Encounter Medications as of 08/29/2024  Medication Sig   cyclobenzaprine  (FLEXERIL ) 5 MG tablet Take 1 tablet (5 mg total) by mouth at bedtime as needed (musculoskeletal pain).   traMADol  (ULTRAM ) 50 MG tablet Take 1 tablet (50 mg total) by mouth daily as needed for moderate pain (pain score 4-6) or severe pain (pain score 7-10).   [DISCONTINUED] amLODipine  (NORVASC ) 5 MG tablet TAKE 1 TABLET(5 MG) BY MOUTH DAILY (Patient not taking: Reported on 05/09/2024)   [DISCONTINUED] atenolol  (TENORMIN ) 50 MG tablet TAKE 1 TABLET(50 MG) BY MOUTH DAILY (Patient not taking: Reported on 05/09/2024)   [DISCONTINUED] omeprazole  (PRILOSEC) 40 MG capsule Take 1 capsule (40 mg total) by mouth daily. (Patient not taking: Reported on 05/09/2024)   [DISCONTINUED] traMADol  (ULTRAM ) 50 MG tablet Take 1  tablet (50 mg total) by mouth every 6 (six) hours as needed for moderate pain (pain score 4-6) or severe pain (pain score 7-10).   No facility-administered encounter medications on file as of 08/29/2024.    Surgical History: History reviewed. No pertinent surgical history.  Medical History: Past Medical History:  Diagnosis Date   Stroke [facility])     Family History: Family History  Family history unknown: Yes    Social History   Socioeconomic History   Marital status: Married    Spouse name: Not on file   Number of children: Not on file   Years of education: Not on file   Highest  education level: Not on file  Occupational History   Not on file  Tobacco Use   Smoking status: Never   Smokeless tobacco: Never  Substance and Sexual Activity   Alcohol use: Never   Drug use: Never   Sexual activity: Not on file  Other Topics Concern   Not on file  Social History Narrative   Not on file   Social Drivers of Health   Financial Resource Strain: Not on file  Food Insecurity: Not on file  Transportation Needs: Not on file  Physical Activity: Not on file  Stress: Not on file  Social Connections: Not on file  Intimate Partner Violence: Not on file      Review of Systems  Constitutional:  Positive for fatigue. Negative for chills and fever.  HENT: Negative.    Respiratory: Negative.  Negative for cough, chest tightness, shortness of breath and wheezing.   Cardiovascular: Negative.  Negative for chest pain and palpitations.  Gastrointestinal: Negative.   Genitourinary: Negative.   Musculoskeletal:  Positive for arthralgias, back pain, gait problem and myalgias.  Neurological:  Positive for weakness.  Psychiatric/Behavioral: Negative.      Vital Signs: BP 136/68   Pulse 66   Temp 97.6 F (36.4 C)   Resp 16   Ht 5' 3 (1.6 m)   Wt 117 lb 12.8 oz (53.4 kg)   SpO2 93%   BMI 20.87 kg/m    Physical Exam Vitals reviewed.  Constitutional:      General: She is not in acute distress.    Appearance: Normal appearance. She is normal weight. She is not ill-appearing.  HENT:     Head: Normocephalic and atraumatic.  Eyes:     Pupils: Pupils are equal, round, and reactive to light.  Cardiovascular:     Rate and Rhythm: Normal rate and regular rhythm.  Pulmonary:     Effort: Pulmonary effort is normal. No respiratory distress.  Skin:    Capillary Refill: Capillary refill takes less than 2 seconds.  Neurological:     Mental Status: She is alert and oriented to person, place, and time.  Psychiatric:        Mood and Affect: Mood normal.        Behavior:  Behavior normal.        Assessment/Plan: 1. Encounter for routine adult health examination with abnormal findings (Primary) Age-appropriate preventive screenings and vaccinations discussed, annual physical exam completed. Routine labs for health maintenance results discussed with patient and family member. PHM updated.    2. Mixed hyperlipidemia Slightly elevated LDL, not currently taking any cholesterol lowering medication.   3. Chronic hip pain, bilateral Tramadol  and cyclobenzaprine  prescribed. Follow up in 3 months for refills.  - traMADol  (ULTRAM ) 50 MG tablet; Take 1 tablet (50 mg total) by mouth daily as needed for moderate pain (  pain score 4-6) or severe pain (pain score 7-10).  Dispense: 30 tablet; Refill: 2 - cyclobenzaprine  (FLEXERIL ) 5 MG tablet; Take 1 tablet (5 mg total) by mouth at bedtime as needed (musculoskeletal pain).  Dispense: 30 tablet; Refill: 2  4. Lumbar radiculopathy Tramadol  and cyclobenzaprine  prescribed. Follow up in 3 months for refills. Patient will also call [facility] to schedule an appointment with Dr. Rankin Rogue.  - traMADol  (ULTRAM ) 50 MG tablet; Take 1 tablet (50 mg total) by mouth daily as needed for moderate pain (pain score 4-6) or severe pain (pain score 7-10).  Dispense: 30 tablet; Refill: 2 - cyclobenzaprine  (FLEXERIL ) 5 MG tablet; Take 1 tablet (5 mg total) by mouth at bedtime as needed (musculoskeletal pain).  Dispense: 30 tablet; Refill: 2    General Counseling: Anacarolina verbalizes understanding of the findings of todays visit and agrees with plan of treatment. I have discussed any further diagnostic evaluation that may be needed or ordered today. We also reviewed her medications today. she has been encouraged to call the office with any questions or concerns that should arise related to todays visit.    No orders of the defined types were placed in this encounter.   Meds ordered this encounter  Medications   traMADol  (ULTRAM ) 50  MG tablet    Sig: Take 1 tablet (50 mg total) by mouth daily as needed for moderate pain (pain score 4-6) or severe pain (pain score 7-10).    Dispense:  30 tablet    Refill:  2    For future refills   cyclobenzaprine  (FLEXERIL ) 5 MG tablet    Sig: Take 1 tablet (5 mg total) by mouth at bedtime as needed (musculoskeletal pain).    Dispense:  30 tablet    Refill:  2    Return in about 12 weeks (around 11/21/2024) for F/U, pain med refill, Rollyn Scialdone PCP.   Total time spent:30 Minutes Time spent includes review of chart, medications, test results, and follow up plan with the patient.   [place] Controlled Substance Database was reviewed by me.  This patient was seen by Mardy Maxin, FNP-C in collaboration with Dr. Sigrid Bathe as a part of collaborative care agreement.  Hovanes Hymas R. Maxin, MSN, FNP-C Internal medicine

## 2024-08-30 ENCOUNTER — Encounter: Payer: Self-pay | Admitting: Nurse Practitioner

## 2024-11-21 ENCOUNTER — Ambulatory Visit: Admitting: Nurse Practitioner

## 2025-09-01 ENCOUNTER — Encounter: Admitting: Nurse Practitioner
